# Patient Record
Sex: Male | Born: 1965 | Race: White | Hispanic: No | Marital: Married | Smoking: Former smoker
Health system: Southern US, Community
[De-identification: ages and names within clinical notes are randomized; demographics above are authoritative.]

## PROBLEM LIST (undated history)

## (undated) DIAGNOSIS — R931 Abnormal findings on diagnostic imaging of heart and coronary circulation: Secondary | ICD-10-CM

## (undated) DIAGNOSIS — Z872 Personal history of diseases of the skin and subcutaneous tissue: Secondary | ICD-10-CM

## (undated) DIAGNOSIS — I351 Nonrheumatic aortic (valve) insufficiency: Secondary | ICD-10-CM

## (undated) DIAGNOSIS — I10 Essential (primary) hypertension: Secondary | ICD-10-CM

## (undated) DIAGNOSIS — G709 Myoneural disorder, unspecified: Secondary | ICD-10-CM

## (undated) DIAGNOSIS — R748 Abnormal levels of other serum enzymes: Secondary | ICD-10-CM

## (undated) DIAGNOSIS — E782 Mixed hyperlipidemia: Secondary | ICD-10-CM

## (undated) DIAGNOSIS — R7303 Prediabetes: Secondary | ICD-10-CM

## (undated) DIAGNOSIS — K589 Irritable bowel syndrome without diarrhea: Secondary | ICD-10-CM

## (undated) HISTORY — DX: Myoneural disorder, unspecified: G70.9

## (undated) HISTORY — DX: Irritable bowel syndrome without diarrhea: K58.9

## (undated) HISTORY — DX: Essential (primary) hypertension: I10

## (undated) HISTORY — PX: HERNIA REPAIR: SHX51

## (undated) HISTORY — DX: Mixed hyperlipidemia: E78.2

## (undated) HISTORY — DX: Personal history of diseases of the skin and subcutaneous tissue: Z87.2

## (undated) HISTORY — DX: Abnormal levels of other serum enzymes: R74.8

## (undated) HISTORY — DX: Nonrheumatic aortic (valve) insufficiency: I35.1

## (undated) HISTORY — DX: Prediabetes: R73.03

## (undated) HISTORY — PX: WISDOM TOOTH EXTRACTION: SHX21

## (undated) HISTORY — DX: Abnormal findings on diagnostic imaging of heart and coronary circulation: R93.1

---

## 2006-01-21 HISTORY — PX: COLONOSCOPY: SHX174

## 2016-11-27 ENCOUNTER — Ambulatory Visit: Payer: BLUE CROSS/BLUE SHIELD | Admitting: Family Medicine

## 2016-11-27 ENCOUNTER — Encounter: Payer: Self-pay | Admitting: Family Medicine

## 2016-11-27 VITALS — BP 140/82 | HR 83 | Ht 68.25 in | Wt 202.0 lb

## 2016-11-27 DIAGNOSIS — Z23 Encounter for immunization: Secondary | ICD-10-CM | POA: Diagnosis not present

## 2016-11-27 DIAGNOSIS — I351 Nonrheumatic aortic (valve) insufficiency: Secondary | ICD-10-CM

## 2016-11-27 DIAGNOSIS — K589 Irritable bowel syndrome without diarrhea: Secondary | ICD-10-CM

## 2016-11-27 DIAGNOSIS — Z7689 Persons encountering health services in other specified circumstances: Secondary | ICD-10-CM | POA: Diagnosis not present

## 2016-11-27 DIAGNOSIS — R1011 Right upper quadrant pain: Secondary | ICD-10-CM

## 2016-11-27 DIAGNOSIS — R740 Nonspecific elevation of levels of transaminase and lactic acid dehydrogenase [LDH]: Secondary | ICD-10-CM | POA: Diagnosis not present

## 2016-11-27 DIAGNOSIS — R931 Abnormal findings on diagnostic imaging of heart and coronary circulation: Secondary | ICD-10-CM | POA: Diagnosis not present

## 2016-11-27 DIAGNOSIS — I10 Essential (primary) hypertension: Secondary | ICD-10-CM

## 2016-11-27 DIAGNOSIS — R748 Abnormal levels of other serum enzymes: Secondary | ICD-10-CM | POA: Diagnosis not present

## 2016-11-27 LAB — POCT URINALYSIS DIP (PROADVANTAGE DEVICE)
BILIRUBIN UA: NEGATIVE mg/dL
Bilirubin, UA: NEGATIVE
Blood, UA: NEGATIVE
GLUCOSE UA: NEGATIVE mg/dL
Leukocytes, UA: NEGATIVE
Nitrite, UA: NEGATIVE
Protein Ur, POC: NEGATIVE mg/dL
SPECIFIC GRAVITY, URINE: 1.015
Urobilinogen, Ur: NEGATIVE
pH, UA: 6.5 (ref 5.0–8.0)

## 2016-11-27 MED ORDER — LOSARTAN POTASSIUM 100 MG PO TABS: 100.0000 mg | ORAL_TABLET | Freq: Every day | ORAL | 5 refills | Status: DC

## 2016-11-27 NOTE — Progress Notes (Signed)
Subjective:    Patient ID: Shawn Ramirez, male    DOB: 12/16/1965, 51 y.o.   MRN: [id number]  HPI Chief Complaint  Patient presents with  . new pt    new pt get established. needs a refill bp, pain under right rib off and on for about a month. will get flu shot today   He is here to establish care. Recently moved here from [place] with his wife. Prior to that they were living in [place]. They are both truck drivers.   Complaints of intermittent RUQ pain for at least one month that is dull and non radiating. Pain typically lasts a few minutes and he cannot relate the pain to anything specific. Not worsening or improved with eating, movement, bowel movements or passing gas. States pain has not been worsening or becoming more frequent.  Last episode of pain 2-3 days ago. Denies nausea, vomiting.   HTN- diagnosed 12-15 years ago. Checks BP at home and his readings have been "normal". States his medication was changed in the past few months and he is doing well on Losartan.   States he has history of "leaky valve" in his heart. Reports having an abnormal echocardiogram in [place] earlier this year.  Denies chest pain, palpitations, shortness of breath.   History of diarrhea. No changes. States he has had colonoscopy for the same issues and everything was fine.  States he can avoid dairy and a lot of fiber and this helps his symptoms. Last colonoscopy over 10 years ago.   Urinary hesitancy for the past several months. Reports history of glucosuria for at least 10 years without elevated serum glucose. PSA checked in fall of 2017 and normal.   Family history of diabetes-mother and MGF, HTN. Father HTN- cataracts. Siblings HTN.    Reviewed allergies, medications, past medical, surgical,  and social history.    Review of Systems  Pertinent positives and negatives in the history of present illness.     Objective:   Physical Exam  Constitutional: He is oriented to person, place, and time. He appears  well-developed and well-nourished. No distress.  HENT:  Mouth/Throat: Oropharynx is clear and moist.  Eyes: Conjunctivae are normal.  Neck: Normal range of motion. Neck supple.  Cardiovascular: Normal rate, regular rhythm and intact distal pulses.  Pulmonary/Chest: Effort normal and breath sounds normal.  Abdominal: Soft. Bowel sounds are normal. He exhibits no distension. There is no hepatosplenomegaly. There is no tenderness. There is no rebound, no guarding, no CVA tenderness, no tenderness at McBurney's point and negative Murphy's sign.  Lymphadenopathy:    He has no cervical adenopathy.  Neurological: He is alert and oriented to person, place, and time.  Skin: Skin is warm and dry. No rash noted. No pallor.  Psychiatric: He has a normal mood and affect. His speech is normal and behavior is normal. Thought content normal.   BP 140/82   Pulse 83   Ht 5' 8.25" (1.734 m)   Wt 202 lb (91.6 kg)   BMI 30.49 kg/m       Assessment & Plan:  Essential hypertension - Plan: CBC with Differential/Platelet, Comprehensive metabolic panel  Needs flu shot - Plan: Flu Vaccine QUAD 36+ mos IM  Encounter to establish care  RUQ pain - Plan: CBC with Differential/Platelet, Comprehensive metabolic panel, POCT Urinalysis DIP (Proadvantage Device)  Elevated liver enzymes  Abnormal echocardiogram  Irritable bowel syndrome, unspecified type  Aortic valve insufficiency, etiology of cardiac valve disease unspecified  Discussed that his  BP is not at goal. Recommend that he keep a close eye on his BP outside of here and report back readings consistently not at goal.  RUQ pain is intermittent and nonspecific. No sign of infectious process. Recommend he pay close attention to his symptoms and look for any triggers or associated symptoms. We will revisit this when he returns for his visit in January or sooner if he is worsening. Will also check labs. Discussed option of US and we will hold off for now.    History of elevated liver enzymes per his record from [place]. He drinks alcohol occasionally.  IBS symptoms appear to be managed with diet for now. He has taken Lomotil most recently per records.  Abnormal echo with normal EF. He is asymptomatic.  Flu shot given.  F/U for fasting CPE. He will be due for colonoscopy.

## 2016-11-27 NOTE — Patient Instructions (Signed)
Goal BP is <130/80.   Keep a close eye on abdominal pain and any related symptoms and let me know if this is getting worse. We will call you with lab results.

## 2016-11-28 ENCOUNTER — Encounter: Payer: Self-pay | Admitting: Family Medicine

## 2016-11-28 DIAGNOSIS — E782 Mixed hyperlipidemia: Secondary | ICD-10-CM | POA: Insufficient documentation

## 2016-11-28 DIAGNOSIS — R931 Abnormal findings on diagnostic imaging of heart and coronary circulation: Secondary | ICD-10-CM | POA: Insufficient documentation

## 2016-11-28 DIAGNOSIS — I351 Nonrheumatic aortic (valve) insufficiency: Secondary | ICD-10-CM

## 2016-11-28 DIAGNOSIS — Z872 Personal history of diseases of the skin and subcutaneous tissue: Secondary | ICD-10-CM

## 2016-11-28 DIAGNOSIS — O99814 Abnormal glucose complicating childbirth: Secondary | ICD-10-CM | POA: Insufficient documentation

## 2016-11-28 DIAGNOSIS — K760 Fatty (change of) liver, not elsewhere classified: Secondary | ICD-10-CM | POA: Insufficient documentation

## 2016-11-28 DIAGNOSIS — I517 Cardiomegaly: Secondary | ICD-10-CM | POA: Insufficient documentation

## 2016-11-28 DIAGNOSIS — R7303 Prediabetes: Secondary | ICD-10-CM

## 2016-11-28 DIAGNOSIS — R748 Abnormal levels of other serum enzymes: Secondary | ICD-10-CM

## 2016-11-28 DIAGNOSIS — K589 Irritable bowel syndrome without diarrhea: Secondary | ICD-10-CM | POA: Insufficient documentation

## 2016-11-28 HISTORY — DX: Mixed hyperlipidemia: E78.2

## 2016-11-28 HISTORY — DX: Irritable bowel syndrome, unspecified: K58.9

## 2016-11-28 HISTORY — DX: Abnormal levels of other serum enzymes: R74.8

## 2016-11-28 HISTORY — DX: Abnormal findings on diagnostic imaging of heart and coronary circulation: R93.1

## 2016-11-28 HISTORY — DX: Prediabetes: R73.03

## 2016-11-28 HISTORY — DX: Personal history of diseases of the skin and subcutaneous tissue: Z87.2

## 2016-11-28 HISTORY — DX: Nonrheumatic aortic (valve) insufficiency: I35.1

## 2016-11-28 LAB — CBC WITH DIFFERENTIAL/PLATELET
BASOS PCT: 0.3 %
Basophils Absolute: 20 cells/uL (ref 0–200)
Eosinophils Absolute: 127 cells/uL (ref 15–500)
Eosinophils Relative: 1.9 %
HCT: 43 % (ref 38.5–50.0)
Hemoglobin: 14.8 g/dL (ref 13.2–17.1)
Lymphs Abs: 2727 cells/uL (ref 850–3900)
MCH: 30.4 pg (ref 27.0–33.0)
MCHC: 34.4 g/dL (ref 32.0–36.0)
MCV: 88.3 fL (ref 80.0–100.0)
MONOS PCT: 6.9 %
MPV: 11.5 fL (ref 7.5–12.5)
NEUTROS ABS: 3363 {cells}/uL (ref 1500–7800)
Neutrophils Relative %: 50.2 %
PLATELETS: 270 10*3/uL (ref 140–400)
RBC: 4.87 10*6/uL (ref 4.20–5.80)
RDW: 12.1 % (ref 11.0–15.0)
TOTAL LYMPHOCYTE: 40.7 %
WBC: 6.7 10*3/uL (ref 3.8–10.8)
WBCMIX: 462 {cells}/uL (ref 200–950)

## 2016-11-28 LAB — COMPREHENSIVE METABOLIC PANEL
AG Ratio: 1.8 (calc) (ref 1.0–2.5)
ALBUMIN MSPROF: 4.5 g/dL (ref 3.6–5.1)
ALT: 60 U/L — ABNORMAL HIGH (ref 9–46)
AST: 34 U/L (ref 10–35)
Alkaline phosphatase (APISO): 59 U/L (ref 40–115)
BUN: 11 mg/dL (ref 7–25)
CHLORIDE: 100 mmol/L (ref 98–110)
CO2: 27 mmol/L (ref 20–32)
CREATININE: 0.9 mg/dL (ref 0.70–1.33)
Calcium: 9.6 mg/dL (ref 8.6–10.3)
GLOBULIN: 2.5 g/dL (ref 1.9–3.7)
GLUCOSE: 90 mg/dL (ref 65–99)
POTASSIUM: 4.4 mmol/L (ref 3.5–5.3)
SODIUM: 136 mmol/L (ref 135–146)
Total Bilirubin: 0.8 mg/dL (ref 0.2–1.2)
Total Protein: 7 g/dL (ref 6.1–8.1)

## 2016-11-28 LAB — HEPATITIS PANEL, ACUTE
HEP A IGM: NONREACTIVE
HEP C AB: NONREACTIVE
Hep B C IgM: NONREACTIVE
Hepatitis B Surface Ag: NONREACTIVE
SIGNAL TO CUT-OFF: 0.01 (ref <1.00)

## 2016-11-28 LAB — TEST AUTHORIZATION

## 2016-12-19 ENCOUNTER — Encounter: Payer: Self-pay | Admitting: Family Medicine

## 2016-12-20 ENCOUNTER — Encounter: Payer: Self-pay | Admitting: Family Medicine

## 2017-01-22 ENCOUNTER — Encounter: Payer: Self-pay | Admitting: Family Medicine

## 2017-01-22 ENCOUNTER — Encounter: Payer: Self-pay | Admitting: Gastroenterology

## 2017-01-22 ENCOUNTER — Ambulatory Visit (INDEPENDENT_AMBULATORY_CARE_PROVIDER_SITE_OTHER): Payer: BLUE CROSS/BLUE SHIELD | Admitting: Family Medicine

## 2017-01-22 VITALS — BP 136/88 | HR 75 | Ht 68.0 in | Wt 201.1 lb

## 2017-01-22 DIAGNOSIS — R7303 Prediabetes: Secondary | ICD-10-CM | POA: Diagnosis not present

## 2017-01-22 DIAGNOSIS — I10 Essential (primary) hypertension: Secondary | ICD-10-CM

## 2017-01-22 DIAGNOSIS — R748 Abnormal levels of other serum enzymes: Secondary | ICD-10-CM | POA: Diagnosis not present

## 2017-01-22 DIAGNOSIS — E782 Mixed hyperlipidemia: Secondary | ICD-10-CM

## 2017-01-22 DIAGNOSIS — Z1211 Encounter for screening for malignant neoplasm of colon: Secondary | ICD-10-CM

## 2017-01-22 DIAGNOSIS — Z0000 Encounter for general adult medical examination without abnormal findings: Secondary | ICD-10-CM | POA: Diagnosis not present

## 2017-01-22 DIAGNOSIS — Z125 Encounter for screening for malignant neoplasm of prostate: Secondary | ICD-10-CM

## 2017-01-22 DIAGNOSIS — Z114 Encounter for screening for human immunodeficiency virus [HIV]: Secondary | ICD-10-CM | POA: Diagnosis not present

## 2017-01-22 LAB — POCT URINALYSIS DIP (PROADVANTAGE DEVICE)
BILIRUBIN UA: NEGATIVE mg/dL
Bilirubin, UA: NEGATIVE
Glucose, UA: NEGATIVE mg/dL
Leukocytes, UA: NEGATIVE
Nitrite, UA: NEGATIVE
PH UA: 6 (ref 5.0–8.0)
PROTEIN UA: NEGATIVE mg/dL
RBC UA: NEGATIVE
SPECIFIC GRAVITY, URINE: 1.03
UUROB: 3.5

## 2017-01-22 NOTE — Patient Instructions (Addendum)
Check your blood pressure every day for the next 3 weeks. Keep a record of these.  If your blood pressure is not consistently less than 130/80 then call in 3 weeks and let me know.  Eat a low salt diet.   Your liver enzymes have been elevated so I am repeating them today. It is good that he stopped drinking alcohol.  I recommend that you start being more physically active.  The recommendation is 150 minutes of physical activity per week.  This will help with your blood sugars and blood pressure.  Call and schedule a dental appointment.  You can check with your insurance as to who is in your network.  You will receive a call from the GI office to schedule a visit to discuss a colonoscopy.   We will call you with your lab results and any further recommendations.    Preventative Care for Adults, Male       REGULAR HEALTH EXAMS:  A routine yearly physical is a good way to check in with your primary care provider about your health and preventive screening. It is also an opportunity to share updates about your health and any concerns you have, and receive a thorough all-over exam.   Most health insurance companies pay for at least some preventative services.  Check with your health plan for specific coverages.  WHAT PREVENTATIVE SERVICES DO MEN NEED?  Adult men should have their weight and blood pressure checked regularly.   Men age 53 and older should have their cholesterol levels checked regularly.  Beginning at age 53 and continuing to age 53, men should be screened for colorectal cancer.  Certain people should may need continued testing until age 53.  Other cancer screening may include exams for testicular and prostate cancer.  Updating vaccinations is part of preventative care.  Vaccinations help protect against diseases such as the flu.  Lab tests are generally done as part of preventative care to screen for anemia and blood disorders, to screen for problems with the kidneys and  liver, to screen for bladder problems, to check blood sugar, and to check your cholesterol level.  Preventative services generally include counseling about diet, exercise, avoiding tobacco, drugs, excessive alcohol consumption, and sexually transmitted infections.    GENERAL RECOMMENDATIONS FOR GOOD HEALTH:  Healthy diet:  Eat a variety of foods, including fruit, vegetables, animal or vegetable protein, such as meat, fish, chicken, and eggs, or beans, lentils, tofu, and grains, such as rice.  Drink plenty of water daily.  Decrease saturated fat in the diet, avoid lots of red meat, processed foods, sweets, fast foods, and fried foods.  Exercise:  Aerobic exercise helps maintain good heart health. At least 30-40 minutes of moderate-intensity exercise is recommended. For example, a brisk walk that increases your heart rate and breathing. This should be done on most days of the week.   Find a type of exercise or a variety of exercises that you enjoy so that it becomes a part of your daily life.  Examples are running, walking, swimming, water aerobics, and biking.  For motivation and support, explore group exercise such as aerobic class, spin class, Zumba, Yoga,or  martial arts, etc.    Set exercise goals for yourself, such as a certain weight goal, walk or run in a race such as a 5k walk/run.  Speak to your primary care provider about exercise goals.  Disease prevention:  If you smoke or chew tobacco, find out from your caregiver how to  quit. It can literally save your life, no matter how long you have been a tobacco user. If you do not use tobacco, never begin.   Maintain a healthy diet and normal weight. Increased weight leads to problems with blood pressure and diabetes.   The Body Mass Index or BMI is a way of measuring how much of your body is fat. Having a BMI above 27 increases the risk of heart disease, diabetes, hypertension, stroke and other problems related to obesity. Your  caregiver can help determine your BMI and based on it develop an exercise and dietary program to help you achieve or maintain this important measurement at a healthful level.  High blood pressure causes heart and blood vessel problems.  Persistent high blood pressure should be treated with medicine if weight loss and exercise do not work.   Fat and cholesterol leaves deposits in your arteries that can block them. This causes heart disease and vessel disease elsewhere in your body.  If your cholesterol is found to be high, or if you have heart disease or certain other medical conditions, then you may need to have your cholesterol monitored frequently and be treated with medication.   Ask if you should have a stress test if your history suggests this. A stress test is a test done on a treadmill that looks for heart disease. This test can find disease prior to there being a problem.  Avoid drinking alcohol in excess (more than two drinks per day).  Avoid use of street drugs. Do not share needles with anyone. Ask for professional help if you need assistance or instructions on stopping the use of alcohol, cigarettes, and/or drugs.  Brush your teeth twice a day with fluoride toothpaste, and floss once a day. Good oral hygiene prevents tooth decay and gum disease. The problems can be painful, unattractive, and can cause other health problems. Visit your dentist for a routine oral and dental check up and preventive care every 6-12 months.   Look at your skin regularly.  Use a mirror to look at your back. Notify your caregivers of changes in moles, especially if there are changes in shapes, colors, a size larger than a pencil eraser, an irregular border, or development of new moles.  Safety:  Use seatbelts 100% of the time, whether driving or as a passenger.  Use safety devices such as hearing protection if you work in environments with loud noise or significant background noise.  Use safety glasses when  doing any work that could send debris in to the eyes.  Use a helmet if you ride a bike or motorcycle.  Use appropriate safety gear for contact sports.  Talk to your caregiver about gun safety.  Use sunscreen with a SPF (or skin protection factor) of 15 or greater.  Lighter skinned people are at a greater risk of skin cancer. Don't forget to also wear sunglasses in order to protect your eyes from too much damaging sunlight. Damaging sunlight can accelerate cataract formation.   Practice safe sex. Use condoms. Condoms are used for birth control and to help reduce the spread of sexually transmitted infections (or STIs).  Some of the STIs are gonorrhea (the clap), chlamydia, syphilis, trichomonas, herpes, HPV (human papilloma virus) and HIV (human immunodeficiency virus) which causes AIDS. The herpes, HIV and HPV are viral illnesses that have no cure. These can result in disability, cancer and death.   Keep carbon monoxide and smoke detectors in your home functioning at all times. Change  the batteries every 6 months or use a model that plugs into the wall.   Vaccinations:  Stay up to date with your tetanus shots and other required immunizations. You should have a booster for tetanus every 10 years. Be sure to get your flu shot every year, since 5%-20% of the [place] population comes down with the flu. The flu vaccine changes each year, so being vaccinated once is not enough. Get your shot in the fall, before the flu season peaks.   Other vaccines to consider:  Pneumococcal vaccine to protect against certain types of pneumonia.  This is normally recommended for adults age 22 or older.  However, adults younger than 53 years old with certain underlying conditions such as diabetes, heart or lung disease should also receive the vaccine.  Shingles vaccine to protect against Varicella Zoster if you are older than age 74, or younger than 53 years old with certain underlying illness.  Hepatitis A vaccine to protect  against a form of infection of the liver by a virus acquired from food.  Hepatitis B vaccine to protect against a form of infection of the liver by a virus acquired from blood or body fluids, particularly if you work in health care.  If you plan to travel internationally, check with your local health department for specific vaccination recommendations.  Cancer Screening:  Most routine colon cancer screening begins at the age of 62. On a yearly basis, doctors may provide special easy to use take-home tests to check for hidden blood in the stool. Sigmoidoscopy or colonoscopy can detect the earliest forms of colon cancer and is life saving. These tests use a small camera at the end of a tube to directly examine the colon. Speak to your caregiver about this at age 31, when routine screening begins (and is repeated every 5 years unless early forms of pre-cancerous polyps or small growths are found).   At the age of 57 men usually start screening for prostate cancer every year. Screening may begin at a younger age for those with higher risk. Those at higher risk include African-Americans or having a family history of prostate cancer. There are two types of tests for prostate cancer:   Prostate-specific antigen (PSA) testing. Recent studies raise questions about prostate cancer using PSA and you should discuss this with your caregiver.   Digital rectal exam (in which your doctor's lubricated and gloved finger feels for enlargement of the prostate through the anus).   Screening for testicular cancer.  Do a monthly exam of your testicles. Gently roll each testicle between your thumb and fingers, feeling for any abnormal lumps. The best time to do this is after a hot shower or bath when the tissues are looser. Notify your caregivers of any lumps, tenderness or changes in size or shape immediately.

## 2017-01-22 NOTE — Progress Notes (Signed)
Subjective:    Patient ID: Shawn Ramirez, male    DOB: [birth year], 52 y.o.   MRN: [id number]  HPI Chief Complaint  Patient presents with  . Annual Exam   He is fairly new to me and our practice and here for a complete physical exam. Previous medical care: moved here from  Last CPE: over a year ago.   HTN- has not been checking his BP at home.  Has taken lisinopril-HCTZ in the past. Cannot recall when or why he stopped taking it.   History of prediabetes with Hgb A1c 6.4% in the past.  History of elevated LFTs.   History of abnormal echocardiogram with mild aortic insufficiency.  Patient states he was told he had a "leaky valve" that is nothing to worry about.  This was done by his PCP in [place].  He was not referred to a cardiologist and states he was told he did not see a need to see one.  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  Diet: fairly healthy  Exercise: nothing regular   Immunizations: flu shot. Tdap 2013  Health maintenance:  Colonoscopy: at least 10 years ago in [place], [place].  Last PSA: denies having this checked.  Last Dental Exam: over a year Last Eye Exam: within the past year   Wears seatbelt always, uses sunscreen, smoke detectors in home and functioning, does not text while driving, feels safe in home environment.  Reviewed allergies, medications, past medical, surgical, family, and social history.    Review of Systems Review of Systems Constitutional: -fever, -chills, -sweats, -unexpected weight change,-fatigue ENT: -runny nose, -ear pain, -sore throat Cardiology:  -chest pain, -palpitations, -edema Respiratory: -cough, -shortness of breath, -wheezing Gastroenterology: -abdominal pain, -nausea, -vomiting, -diarrhea, -constipation  Hematology: -bleeding or bruising problems Musculoskeletal: -arthralgias, -myalgias, -joint  swelling, -back pain Ophthalmology: -vision changes Urology: -dysuria, -difficulty urinating, -hematuria, -urinary frequency, -urgency Neurology: -headache, -weakness, -tingling, -numbness       Objective:   Physical Exam BP 136/88   Pulse 75   Ht 5\' 8"  (1.727 m)   Wt 201 lb 1.6 oz (91.2 kg)   SpO2 96%   BMI 30.58 kg/m   General Appearance:    Alert, cooperative, no distress, appears stated age  Head:    Normocephalic, without obvious abnormality, atraumatic  Eyes:    PERRL, conjunctiva/corneas clear, EOM's intact, fundi    benign  Ears:    Normal TM's and external ear canals  Nose:   Nares normal, mucosa normal, no drainage or sinus   tenderness  Throat:   Lips, mucosa, and tongue normal; teeth and gums normal  Neck:   Supple, no lymphadenopathy;  thyroid:  no   enlargement/tenderness/nodules; no carotid   bruit or JVD  Back:    Spine nontender, no curvature, ROM normal, no CVA     tenderness  Lungs:     Clear to auscultation bilaterally without wheezes, rales or     ronchi; respirations unlabored  Chest Wall:    No tenderness or deformity   Heart:    Regular rate and rhythm, S1 and S2 normal, no murmur, rub   or gallop  Breast Exam:    No chest wall tenderness, masses or gynecomastia  Abdomen:     Soft, non-tender, nondistended, normoactive bowel sounds,    no masses, no hepatosplenomegaly  Genitalia:  Normal male external genitalia without lesions.  Testicles without masses.  No inguinal hernias.     Extremities:   No clubbing, cyanosis or edema  Pulses:   2+ and symmetric all extremities  Skin:   Skin color, texture, turgor normal, no rashes or lesions  Lymph nodes:   Cervical, supraclavicular, and axillary nodes normal  Neurologic:   CNII-XII intact, normal strength, sensation and gait; reflexes 2+ and symmetric throughout          Psych:   Normal mood, affect, hygiene and grooming.     Urinalysis dipstick: negative       Assessment & Plan:  Routine general  medical examination at a health care facility - Plan: POCT Urinalysis DIP (Proadvantage Device), Lipid panel, HIV antibody, Comprehensive metabolic panel  Essential hypertension  Mixed hyperlipidemia - Plan: Lipid panel  Prediabetes - Plan: POCT Urinalysis DIP (Proadvantage Device), Comprehensive metabolic panel, Hemoglobin A1c  Elevated liver enzymes - Plan: POCT Urinalysis DIP (Proadvantage Device), Comprehensive metabolic panel  Screen for colon cancer - Plan: Ambulatory referral to Gastroenterology  Screening for prostate cancer - Plan: PSA  Screening for HIV (human immunodeficiency virus) - Plan: HIV antibody  He appears to be doing well physically and emotionally. Discussed that his blood pressure is not in goal range today.  Recommend that he start checking it at home on a daily basis and call in 3 weeks to let me know if the majority of his readings are abnormal.  He will continue on current medication.  We discussed lifestyle modifications such as eating a low-salt diet and increasing his physical activity.  He was given a handout on the [employer] diet. Lifestyle modifications were also discussed for prediabetes.  We will check a hemoglobin A1c.  As far as I can tell from his medical records his A1c last year was 6.4%. Elevated liver enzymes in the past and in November in our office.  Negative hepatitis panel.  Discussed that if his liver enzymes are still abnormal that I will send him for an ultrasound to take a look at his liver. Congratulated him on stopping alcohol use. Apparently he had a colonoscopy more than 10 years ago and [place] due to chronic diarrhea. GI referral for screening colonoscopy ordered. Health maintenance-screening recommendations ordered. He appears to be up-to-date on flu shot and all immunizations. We will follow-up pending labs and he will call in 3 weeks with his blood pressure readings.

## 2017-01-23 ENCOUNTER — Telehealth: Payer: Self-pay

## 2017-01-23 ENCOUNTER — Other Ambulatory Visit: Payer: Self-pay

## 2017-01-23 DIAGNOSIS — R748 Abnormal levels of other serum enzymes: Secondary | ICD-10-CM

## 2017-01-23 LAB — LIPID PANEL
CHOL/HDL RATIO: 4.2 (calc) (ref <5.0)
CHOLESTEROL: 172 mg/dL (ref <200)
HDL: 41 mg/dL (ref >40)
LDL CHOLESTEROL (CALC): 113 mg/dL — AB
Non-HDL Cholesterol (Calc): 131 mg/dL (calc) — ABNORMAL HIGH (ref <130)
Triglycerides: 82 mg/dL (ref <150)

## 2017-01-23 LAB — COMPREHENSIVE METABOLIC PANEL
AG RATIO: 1.7 (calc) (ref 1.0–2.5)
ALBUMIN MSPROF: 4.7 g/dL (ref 3.6–5.1)
ALKALINE PHOSPHATASE (APISO): 67 U/L (ref 40–115)
ALT: 54 U/L — ABNORMAL HIGH (ref 9–46)
AST: 31 U/L (ref 10–35)
BUN: 21 mg/dL (ref 7–25)
CHLORIDE: 104 mmol/L (ref 98–110)
CO2: 26 mmol/L (ref 20–32)
CREATININE: 1.02 mg/dL (ref 0.70–1.33)
Calcium: 9.5 mg/dL (ref 8.6–10.3)
GLOBULIN: 2.7 g/dL (ref 1.9–3.7)
Glucose, Bld: 106 mg/dL — ABNORMAL HIGH (ref 65–99)
POTASSIUM: 4.6 mmol/L (ref 3.5–5.3)
Sodium: 138 mmol/L (ref 135–146)
Total Bilirubin: 0.6 mg/dL (ref 0.2–1.2)
Total Protein: 7.4 g/dL (ref 6.1–8.1)

## 2017-01-23 LAB — HEMOGLOBIN A1C
HEMOGLOBIN A1C: 6.1 %{Hb} — AB (ref <5.7)
MEAN PLASMA GLUCOSE: 128 (calc)
eAG (mmol/L): 7.1 (calc)

## 2017-01-23 LAB — PSA: PSA: 1.8 ng/mL (ref <=4.0)

## 2017-01-23 LAB — HIV ANTIBODY (ROUTINE TESTING W REFLEX): HIV 1&2 Ab, 4th Generation: NONREACTIVE

## 2017-01-23 NOTE — Telephone Encounter (Signed)
Called and LVM advising patient to call back to discuss the lab results and the need for US. Left call back number.

## 2017-01-23 NOTE — Telephone Encounter (Signed)
-----   Message from Avanell ShackletonVickie L Henson, NP-C sent at 01/23/2017  8:32 AM EST ----- He has prediabetes which means he should pay close attention to his sugar and carbohydrate intake and increase his physical activity to prevent this from progressing to diabetes.  Also, his LDL or "bad" cholesterol is mildly elevated so he should pay close attention to his fried foods and fatty intake and cut back.  Exercise will also improve this. His prostate test is normal.  One of his liver enzymes is still elevated in spite of him stopping alcohol so I would like to send him for a RUQ US to look at his liver. Please order a RUQ US due to elevated ALT and intermittent RUQ pain.

## 2017-02-10 ENCOUNTER — Other Ambulatory Visit: Payer: Self-pay

## 2017-02-17 ENCOUNTER — Ambulatory Visit
Admission: RE | Admit: 2017-02-17 | Discharge: 2017-02-17 | Disposition: A | Discharge status: Home / Self Care | Payer: BLUE CROSS/BLUE SHIELD | Source: Ambulatory Visit | Attending: Family Medicine | Admitting: Family Medicine

## 2017-02-17 DIAGNOSIS — R945 Abnormal results of liver function studies: Secondary | ICD-10-CM | POA: Diagnosis not present

## 2017-02-17 DIAGNOSIS — R748 Abnormal levels of other serum enzymes: Secondary | ICD-10-CM

## 2017-02-20 ENCOUNTER — Telehealth: Payer: Self-pay | Admitting: Family Medicine

## 2017-02-20 NOTE — Telephone Encounter (Signed)
The report mentions that he may have fatty liver and no level of severity is noted. He may want to schedule an office visit if he has several questions. We can repeat his liver function tests in 6-8 weeks and see how things are looking. If his liver enzymes are not improving or if they are getting worse, then I can refer him to GI for further evaluation. He may see GI now if he would like but I do not think they would not do anything differently at this point.

## 2017-02-20 NOTE — Telephone Encounter (Signed)
Left message for pt to call me back 

## 2017-02-20 NOTE — Telephone Encounter (Signed)
Please advise 

## 2017-02-20 NOTE — Telephone Encounter (Signed)
Pt called requesting a call back to discuss labs results further. He specifically wants to know about liver results and if there is a level of severity for fatty liver disease.

## 2017-02-21 NOTE — Telephone Encounter (Signed)
Pt was notified and will follow-up in 6-8 weeks

## 2017-02-21 NOTE — Telephone Encounter (Signed)
Left message for pt to call me back 

## 2017-02-24 ENCOUNTER — Ambulatory Visit (AMBULATORY_SURGERY_CENTER): Payer: Self-pay | Admitting: *Deleted

## 2017-02-24 ENCOUNTER — Other Ambulatory Visit: Payer: Self-pay

## 2017-02-24 VITALS — Ht 68.0 in | Wt 200.0 lb

## 2017-02-24 DIAGNOSIS — Z1211 Encounter for screening for malignant neoplasm of colon: Secondary | ICD-10-CM

## 2017-02-24 MED ORDER — NA SULFATE-K SULFATE-MG SULF 17.5-3.13-1.6 GM/177ML PO SOLN: 1.0000 | Freq: Once | ORAL | 0 refills | Status: AC

## 2017-02-24 NOTE — Progress Notes (Signed)
No egg or soy allergy known to patient  No issues with past sedation with any surgeries  or procedures, no intubation problems  No diet pills per patient No home 02 use per patient  No blood thinners per patient  Pt denies issues with constipation  No A fib or A flutter  EMMI video sent to pt's e mail pt declined   

## 2017-02-25 ENCOUNTER — Encounter: Payer: Self-pay | Admitting: Gastroenterology

## 2017-03-11 ENCOUNTER — Encounter: Payer: Self-pay | Admitting: Gastroenterology

## 2017-03-31 ENCOUNTER — Encounter: Payer: Self-pay | Admitting: Family Medicine

## 2017-03-31 ENCOUNTER — Ambulatory Visit: Payer: BLUE CROSS/BLUE SHIELD | Admitting: Family Medicine

## 2017-03-31 VITALS — BP 120/70 | HR 62 | Wt 190.0 lb

## 2017-03-31 DIAGNOSIS — R748 Abnormal levels of other serum enzymes: Secondary | ICD-10-CM

## 2017-03-31 DIAGNOSIS — I10 Essential (primary) hypertension: Secondary | ICD-10-CM | POA: Diagnosis not present

## 2017-03-31 DIAGNOSIS — R197 Diarrhea, unspecified: Secondary | ICD-10-CM | POA: Diagnosis not present

## 2017-03-31 NOTE — Progress Notes (Signed)
   Subjective:    Patient ID: Shawn MccallumGlen Hagood, male    DOB: 08/07/1965, 52 y.o.   MRN: [id number]  HPI Chief Complaint  Patient presents with  . follow-up    follow-up on liver enzymes   He is here to follow up on elevated liver enzymes. States his "liver has been hurting" Continues to complain of intermittent right upper quadrant pain.   States he has not had pain in at least 3 or 4 days.  States he thought fatty liver could cause this type of pain.  He also thinks his diet may contribute to abdominal pain and intermittent diarrhea.  This has not worsened since his last visit.  No new symptoms. He reports that he stopped drinking alcohol when he found out his liver enzymes were elevated.  This was months ago.   Denies fever, chills, unexplained fatigue, dizziness, chest pain, palpitations, shortness of breath, cough, nausea, vomiting.  States he went to see GI and he was told he has to pay $2,500 up front so he has not scheduled a visit with them.   Apparently he has been taking Losartan for years. He thinks this medication may be causing his intermittent diarrhea.  He has tried lisinopril in the past- he was switched but does not know   HCTZ in the past and he did not like that this medication made him urinate.    Reviewed allergies, medications, past medical, surgical, family, and social history.   Review of Systems Pertinent positives and negatives in the history of present illness.      Objective:   Physical Exam BP 120/70   Pulse 62   Wt 190 lb (86.2 kg)   BMI 28.89 kg/m  Alert and oriented and in no acute distress.  Not otherwise examined.       Assessment & Plan:  Elevated liver enzymes - Plan: Comprehensive metabolic panel  Intermittent diarrhea  Essential hypertension  He plans to stop the losartan because he thinks this is causing his diarrhea even though I reassured him this is unlikely based on the fact he had diarrhea prior to starting the medication and the  diarrhea is intermittent while he takes losartan daily.  He plans to start taking garlic for his blood pressure.  I advised that his blood pressure has been pretty well controlled on the losartan and that if he stops the medication that he will need to keep a close eye on his blood pressure. Congratulated him on stopping alcohol use.  Discussed that in general, fatty liver does not cause pain.  He does not seem to have a good understanding about his health. We will repeat liver enzymes today and have him follow-up with GI.  My CMA did call [facility] and I reassured the patient that they will bill his insurance and see him without him paying his deductible upfront.

## 2017-03-31 NOTE — Patient Instructions (Addendum)
Goal BP is <130/80. Your BP today is in goal range.   Call and schedule an appointment to follow up with your gastroenterologist.    DASH Eating Plan DASH stands for "Dietary Approaches to Stop Hypertension." The DASH eating plan is a healthy eating plan that has been shown to reduce high blood pressure (hypertension). It may also reduce your risk for type 2 diabetes, heart disease, and stroke. The DASH eating plan may also help with weight loss. What are tips for following this plan? General guidelines  Avoid eating more than 2,300 mg (milligrams) of salt (sodium) a day. If you have hypertension, you may need to reduce your sodium intake to 1,500 mg a day.  Limit alcohol intake to no more than 1 drink a day for nonpregnant women and 2 drinks a day for men. One drink equals 12 oz of beer, 5 oz of wine, or 1 oz of hard liquor.  Work with your health care provider to maintain a healthy body weight or to lose weight. Ask what an ideal weight is for you.  Get at least 30 minutes of exercise that causes your heart to beat faster (aerobic exercise) most days of the week. Activities may include walking, swimming, or biking.  Work with your health care provider or diet and nutrition specialist (dietitian) to adjust your eating plan to your individual calorie needs. Reading food labels  Check food labels for the amount of sodium per serving. Choose foods with less than 5 percent of the Daily Value of sodium. Generally, foods with less than 300 mg of sodium per serving fit into this eating plan.  To find whole grains, look for the word "whole" as the first word in the ingredient list. Shopping  Buy products labeled as "low-sodium" or "no salt added."  Buy fresh foods. Avoid canned foods and premade or frozen meals. Cooking  Avoid adding salt when cooking. Use salt-free seasonings or herbs instead of table salt or sea salt. Check with your health care provider or pharmacist before using salt  substitutes.  Do not fry foods. Cook foods using healthy methods such as baking, boiling, grilling, and broiling instead.  Cook with heart-healthy oils, such as olive, canola, soybean, or sunflower oil. Meal planning   Eat a balanced diet that includes: ? 5 or more servings of fruits and vegetables each day. At each meal, try to fill half of your plate with fruits and vegetables. ? Up to 6-8 servings of whole grains each day. ? Less than 6 oz of lean meat, poultry, or fish each day. A 3-oz serving of meat is about the same size as a deck of cards. One egg equals 1 oz. ? 2 servings of low-fat dairy each day. ? A serving of nuts, seeds, or beans 5 times each week. ? Heart-healthy fats. Healthy fats called Omega-3 fatty acids are found in foods such as flaxseeds and coldwater fish, like sardines, salmon, and mackerel.  Limit how much you eat of the following: ? Canned or prepackaged foods. ? Food that is high in trans fat, such as fried foods. ? Food that is high in saturated fat, such as fatty meat. ? Sweets, desserts, sugary drinks, and other foods with added sugar. ? Full-fat dairy products.  Do not salt foods before eating.  Try to eat at least 2 vegetarian meals each week.  Eat more home-cooked food and less restaurant, buffet, and fast food.  When eating at a restaurant, ask that your food be prepared  with less salt or no salt, if possible. What foods are recommended? The items listed may not be a complete list. Talk with your dietitian about what dietary choices are best for you. Grains Whole-grain or whole-wheat bread. Whole-grain or whole-wheat pasta. Brown rice. [place]. Bulgur. Whole-grain and low-sodium cereals. Pita bread. Low-fat, low-sodium crackers. Whole-wheat flour tortillas. Vegetables Fresh or frozen vegetables (raw, steamed, roasted, or grilled). Low-sodium or reduced-sodium tomato and vegetable juice. Low-sodium or reduced-sodium tomato sauce and tomato  paste. Low-sodium or reduced-sodium canned vegetables. Fruits All fresh, dried, or frozen fruit. Canned fruit in natural juice (without added sugar). Meat and other protein foods Skinless chicken or [place]. Ground chicken or [place]. Pork with fat trimmed off. Fish and seafood. Egg whites. Dried beans, peas, or lentils. Unsalted nuts, nut butters, and seeds. Unsalted canned beans. Lean cuts of beef with fat trimmed off. Low-sodium, lean deli meat. Dairy Low-fat (1%) or fat-free (skim) milk. Fat-free, low-fat, or reduced-fat cheeses. Nonfat, low-sodium ricotta or cottage cheese. Low-fat or nonfat yogurt. Low-fat, low-sodium cheese. Fats and oils Soft margarine without trans fats. Vegetable oil. Low-fat, reduced-fat, or light mayonnaise and salad dressings (reduced-sodium). Canola, safflower, olive, soybean, and sunflower oils. Avocado. Seasoning and other foods Herbs. Spices. Seasoning mixes without salt. Unsalted popcorn and pretzels. Fat-free sweets. What foods are not recommended? The items listed may not be a complete list. Talk with your dietitian about what dietary choices are best for you. Grains Baked goods made with fat, such as croissants, muffins, or some breads. Dry pasta or rice meal packs. Vegetables Creamed or fried vegetables. Vegetables in a cheese sauce. Regular canned vegetables (not low-sodium or reduced-sodium). Regular canned tomato sauce and paste (not low-sodium or reduced-sodium). Regular tomato and vegetable juice (not low-sodium or reduced-sodium). Angie Fava. Olives. Fruits Canned fruit in a light or heavy syrup. Fried fruit. Fruit in cream or butter sauce. Meat and other protein foods Fatty cuts of meat. Ribs. Fried meat. Berniece Salines. Sausage. Bologna and other processed lunch meats. Salami. Fatback. Hotdogs. Bratwurst. Salted nuts and seeds. Canned beans with added salt. Canned or smoked fish. Whole eggs or egg yolks. Chicken or [place] with skin. Dairy Whole or 2% milk,  cream, and half-and-half. Whole or full-fat cream cheese. Whole-fat or sweetened yogurt. Full-fat cheese. Nondairy creamers. Whipped toppings. Processed cheese and cheese spreads. Fats and oils Butter. Stick margarine. Lard. Shortening. Ghee. Bacon fat. Tropical oils, such as coconut, palm kernel, or palm oil. Seasoning and other foods Salted popcorn and pretzels. Onion salt, garlic salt, seasoned salt, table salt, and sea salt. Worcestershire sauce. Tartar sauce. Barbecue sauce. Teriyaki sauce. Soy sauce, including reduced-sodium. Steak sauce. Canned and packaged gravies. Fish sauce. Oyster sauce. Cocktail sauce. Horseradish that you find on the shelf. Ketchup. Mustard. Meat flavorings and tenderizers. Bouillon cubes. Hot sauce and Tabasco sauce. Premade or packaged marinades. Premade or packaged taco seasonings. Relishes. Regular salad dressings. Where to find more information:  National Heart, Lung, and [place]: https://wilson-eaton.com/  American Heart Association: www.heart.org Summary  The DASH eating plan is a healthy eating plan that has been shown to reduce high blood pressure (hypertension). It may also reduce your risk for type 2 diabetes, heart disease, and stroke.  With the DASH eating plan, you should limit salt (sodium) intake to 2,300 mg a day. If you have hypertension, you may need to reduce your sodium intake to 1,500 mg a day.  When on the DASH eating plan, aim to eat more fresh fruits and vegetables, whole grains, lean  proteins, low-fat dairy, and heart-healthy fats.  Work with your health care provider or diet and nutrition specialist (dietitian) to adjust your eating plan to your individual calorie needs. This information is not intended to replace advice given to you by your health care provider. Make sure you discuss any questions you have with your health care provider. Document Released: 12/27/2010 Document Revised: 01/01/2016 Document Reviewed: 01/01/2016 Elsevier  Interactive Patient Education  [employer].

## 2017-04-01 LAB — COMPREHENSIVE METABOLIC PANEL
ALBUMIN: 4.8 g/dL (ref 3.5–5.5)
ALT: 58 IU/L — ABNORMAL HIGH (ref 0–44)
AST: 32 IU/L (ref 0–40)
Albumin/Globulin Ratio: 2 (ref 1.2–2.2)
Alkaline Phosphatase: 66 IU/L (ref 39–117)
BUN/Creatinine Ratio: 14 (ref 9–20)
BUN: 12 mg/dL (ref 6–24)
Bilirubin Total: 0.8 mg/dL (ref 0.0–1.2)
CO2: 21 mmol/L (ref 20–29)
CREATININE: 0.86 mg/dL (ref 0.76–1.27)
Calcium: 10 mg/dL (ref 8.7–10.2)
Chloride: 102 mmol/L (ref 96–106)
GFR calc Af Amer: 115 mL/min/{1.73_m2} (ref >59)
GFR calc non Af Amer: 100 mL/min/{1.73_m2} (ref >59)
GLUCOSE: 101 mg/dL — AB (ref 65–99)
Globulin, Total: 2.4 g/dL (ref 1.5–4.5)
Potassium: 5.1 mmol/L (ref 3.5–5.2)
Sodium: 141 mmol/L (ref 134–144)
TOTAL PROTEIN: 7.2 g/dL (ref 6.0–8.5)

## 2017-04-16 ENCOUNTER — Ambulatory Visit: Payer: BLUE CROSS/BLUE SHIELD | Admitting: Family Medicine

## 2017-04-16 ENCOUNTER — Encounter: Payer: Self-pay | Admitting: Family Medicine

## 2017-04-16 VITALS — BP 140/90 | HR 55 | Temp 97.6°F | Wt 188.2 lb

## 2017-04-16 DIAGNOSIS — R351 Nocturia: Secondary | ICD-10-CM | POA: Diagnosis not present

## 2017-04-16 DIAGNOSIS — R350 Frequency of micturition: Secondary | ICD-10-CM | POA: Diagnosis not present

## 2017-04-16 DIAGNOSIS — R1030 Lower abdominal pain, unspecified: Secondary | ICD-10-CM

## 2017-04-16 DIAGNOSIS — R3911 Hesitancy of micturition: Secondary | ICD-10-CM | POA: Diagnosis not present

## 2017-04-16 LAB — POCT URINALYSIS DIP (PROADVANTAGE DEVICE)
Bilirubin, UA: NEGATIVE
GLUCOSE UA: NEGATIVE mg/dL
Ketones, POC UA: NEGATIVE mg/dL
Leukocytes, UA: NEGATIVE
NITRITE UA: NEGATIVE
PH UA: 7 (ref 5.0–8.0)
PROTEIN UA: NEGATIVE mg/dL
RBC UA: NEGATIVE
SPECIFIC GRAVITY, URINE: 1.02
UUROB: NEGATIVE

## 2017-04-16 MED ORDER — TAMSULOSIN HCL 0.4 MG PO CAPS
0.4000 mg | ORAL_CAPSULE | Freq: Every day | ORAL | 1 refills | Status: DC
Start: 2017-04-16 — End: 2017-05-05

## 2017-04-16 MED ORDER — SULFAMETHOXAZOLE-TRIMETHOPRIM 800-160 MG PO TABS
1.0000 | ORAL_TABLET | Freq: Two times a day (BID) | ORAL | 0 refills | Status: DC
Start: 2017-04-16 — End: 2017-05-05

## 2017-04-16 NOTE — Patient Instructions (Addendum)
Start taking the tamsulosin once daily.  Take the antibiotic as prescribed for the next 10 days. Follow-up with me in 2 weeks to see how you are doing.  Keep an eye on your blood pressure at home since you have reduced your dose of losartan.  Your blood pressure should be less than 130/80 on a consistent basis to be well controlled.

## 2017-04-16 NOTE — Progress Notes (Signed)
Subjective:    Patient ID: Shawn MccallumGlen Ramirez, male    DOB: 06/24/1965, 52 y.o.   MRN: [id number]  HPI Chief Complaint  Patient presents with  . abdominal pain    abdominal pain, pain in groin area- feels like a knot- symptoms started a long time ago but getting worse   He is here with complaints of a several week history of intermittent pain in his lower abdomen that is present only when his bladder is full. States pain is relieved with urination. He also reports having hesitency and does not feel like he is emptying his bladder. He also notes leakage after voiding and frequent urination.   Nocturia- almost every hour for several months. This is gradually worsening.  Denies history of prostatitis or BPH.   He also complains of intermittent right lower groin pain that has been ongoing for over a year. Pain is worse with sitting and relieved with standing. No pain with exertion or straining.   States he reduced his losartan to 50 mg and his BP has been normal on this dose.   Denies fever, chills, dizziness, chest pain, palpitations, shortness of breath, cough, N/V/D.   Reviewed allergies, medications, past medical, surgical, family, and social history.    Review of Systems Pertinent positives and negatives in the history of present illness.     Objective:   Physical Exam  Constitutional: He is oriented to person, place, and time. He appears well-developed and well-nourished. No distress.  Cardiovascular: Normal rate and regular rhythm.  Pulmonary/Chest: Effort normal and breath sounds normal.  Abdominal: Soft. Normal appearance and bowel sounds are normal. There is no hepatosplenomegaly. There is no tenderness. There is no rigidity, no rebound, no guarding, no CVA tenderness, no tenderness at McBurney's point and negative Murphy's sign. Hernia confirmed negative in the right inguinal area and confirmed negative in the left inguinal area.  Genitourinary: Rectum normal, testes normal and  penis normal. Rectal exam shows no tenderness and guaiac negative stool. Prostate is not enlarged and not tender.  Genitourinary Comments: Prostate is slightly boggy  Lymphadenopathy:       Right: No inguinal adenopathy present.       Left: No inguinal adenopathy present.  Neurological: He is alert and oriented to person, place, and time. He has normal strength. No cranial nerve deficit or sensory deficit.  Skin: Skin is warm and dry. No pallor.  Psychiatric: He has a normal mood and affect. His speech is normal and behavior is normal.   BP 140/90   Pulse (!) 55   Temp 97.6 F (36.4 C) (Oral)   Wt 188 lb 3.2 oz (85.4 kg)   SpO2 98%   BMI 28.62 kg/m       Assessment & Plan:  Urinary frequency - Plan: sulfamethoxazole-trimethoprim (BACTRIM DS,SEPTRA DS) 800-160 MG tablet, tamsulosin (FLOMAX) 0.4 MG CAPS capsule, Urine Culture  Lower abdominal pain - Plan: POCT Urinalysis DIP (Proadvantage Device), Urine Culture  Urinary hesitancy - Plan: sulfamethoxazole-trimethoprim (BACTRIM DS,SEPTRA DS) 800-160 MG tablet, tamsulosin (FLOMAX) 0.4 MG CAPS capsule, Urine Culture  Nocturia - Plan: sulfamethoxazole-trimethoprim (BACTRIM DS,SEPTRA DS) 800-160 MG tablet, Urine Culture  UA dipstick is negative.  Will send for culture. Prostate is slightly boggy.  Plan to treat with Bactrim and Flomax and see if his urinary symptoms improve over the next couple of weeks.  Discussed that his PSA was normal in January.  I will have him follow-up in 2 weeks.  He is aware that his blood pressure  is elevated today.  He decreased his dose of blood pressure medication and has been checking it at home.  He is aware that he will need to keep an eye on his blood pressure and I did advise him to avoid adjusting his medications without discussing it with me. If if symptoms do not improve over the next 2 weeks, we will refer him to him to urology.

## 2017-04-17 LAB — URINE CULTURE

## 2017-05-05 ENCOUNTER — Encounter: Payer: Self-pay | Admitting: Family Medicine

## 2017-05-05 ENCOUNTER — Ambulatory Visit: Payer: BLUE CROSS/BLUE SHIELD | Admitting: Family Medicine

## 2017-05-05 ENCOUNTER — Telehealth: Payer: Self-pay

## 2017-05-05 VITALS — BP 120/70 | HR 69 | Temp 97.5°F | Resp 16 | Ht 68.0 in | Wt 185.6 lb

## 2017-05-05 DIAGNOSIS — R350 Frequency of micturition: Secondary | ICD-10-CM | POA: Diagnosis not present

## 2017-05-05 DIAGNOSIS — R197 Diarrhea, unspecified: Secondary | ICD-10-CM

## 2017-05-05 DIAGNOSIS — R1011 Right upper quadrant pain: Secondary | ICD-10-CM

## 2017-05-05 LAB — POCT URINALYSIS DIP (PROADVANTAGE DEVICE)
Bilirubin, UA: NEGATIVE
Glucose, UA: NEGATIVE mg/dL
Ketones, POC UA: NEGATIVE mg/dL
Leukocytes, UA: NEGATIVE
NITRITE UA: NEGATIVE
PH UA: 7 (ref 5.0–8.0)
PROTEIN UA: NEGATIVE mg/dL
RBC UA: NEGATIVE
SPECIFIC GRAVITY, URINE: 1.015
UUROB: NEGATIVE

## 2017-05-05 NOTE — Telephone Encounter (Signed)
I have faxed over referral to GI

## 2017-05-05 NOTE — Telephone Encounter (Signed)
Patient has called to inform us that he can be referred [facility] to see Dr. Loreta AveMann or Dr. Elnoria HowardHung. Please place referral for GI.

## 2017-05-05 NOTE — Telephone Encounter (Signed)
Please take care of this.  

## 2017-05-05 NOTE — Progress Notes (Signed)
   Subjective:    Patient ID: Shawn MccallumGlen Ramirez, male    DOB: 05/02/1965, 52 y.o.   MRN: [id number]  HPI Chief Complaint  Patient presents with  . 2 week follow-up    2 week follow-up on urine frequency. thinks it was internal hemorrhoids so he got suppositories    He is here for a 2 week follow up on urinary symptoms. He completed the antibiotic and states he no longer has groin pain or urinary frequency.   States he thinks his urinary symptoms were related to internal hemorrhoids. States his wife told him this and had him try OTC suppositories and his pain has resolved.  States he continues having intermittent right upper quadrant pain however along with intermittent diarrhea that it worse with certain foods. RUQ pain seems to be unrelated to eating.  He has been referred to GI for this in the past but has not been. He does want to pursue this now.   History of elevated liver enzymes and he is concerned about this. We have discussed this on many occasions and how to prevent worsening liver function. He has stopped drinking alcohol.   Denies fever, chills, dizziness, chest pain, palpitations, shortness of breath,  N/V, urinary symptoms, LE edema.   Reviewed allergies, medications, past medical, surgical, family, and social history.    Review of Systems Pertinent positives and negatives in the history of present illness.     Objective:   Physical Exam BP 120/70   Pulse 69   Temp (!) 97.5 F (36.4 C) (Oral)   Resp 16   Ht 5\' 8"  (1.727 m)   Wt 185 lb 9.6 oz (84.2 kg)   SpO2 97%   BMI 28.22 kg/m   Alert and oriented and in no acute distress. Not otherwise examined. Declines GU exam.       Assessment & Plan:  Urinary frequency  RUQ abdominal pain - Plan: POCT Urinalysis DIP (Proadvantage Device), Ambulatory referral to Gastroenterology  Intermittent diarrhea - Plan: Ambulatory referral to Gastroenterology  Discussed that his urinalysis dipstick was negative on 04/16/2017 and is  negative again today. No longer having groin pain or frequency.  He has not noticed any changes with urinary symptoms so we will have him stop the Flomax for now.  Recommend he see GI for ongoing intermittent RUQ pain. He is in agreement.

## 2017-05-05 NOTE — Patient Instructions (Signed)
Stop the Flomax.   Your urine looks fine today.   Call and schedule with your gastroenterologist as discussed for ongoing intermittent right upper abdominal pain and intermittent diarrhea.

## 2017-05-14 DIAGNOSIS — R197 Diarrhea, unspecified: Secondary | ICD-10-CM | POA: Diagnosis not present

## 2017-05-14 DIAGNOSIS — K760 Fatty (change of) liver, not elsewhere classified: Secondary | ICD-10-CM | POA: Diagnosis not present

## 2017-05-14 DIAGNOSIS — R740 Nonspecific elevation of levels of transaminase and lactic acid dehydrogenase [LDH]: Secondary | ICD-10-CM | POA: Diagnosis not present

## 2017-05-14 DIAGNOSIS — R1011 Right upper quadrant pain: Secondary | ICD-10-CM | POA: Diagnosis not present

## 2017-05-22 DIAGNOSIS — R1011 Right upper quadrant pain: Secondary | ICD-10-CM | POA: Diagnosis not present

## 2017-05-22 DIAGNOSIS — K3189 Other diseases of stomach and duodenum: Secondary | ICD-10-CM | POA: Diagnosis not present

## 2017-05-22 DIAGNOSIS — K319 Disease of stomach and duodenum, unspecified: Secondary | ICD-10-CM | POA: Diagnosis not present

## 2017-05-22 DIAGNOSIS — Z1211 Encounter for screening for malignant neoplasm of colon: Secondary | ICD-10-CM | POA: Diagnosis not present

## 2017-05-22 DIAGNOSIS — R197 Diarrhea, unspecified: Secondary | ICD-10-CM | POA: Diagnosis not present

## 2017-05-22 LAB — HM COLONOSCOPY

## 2017-07-09 ENCOUNTER — Encounter: Payer: Self-pay | Admitting: Family Medicine

## 2017-07-09 ENCOUNTER — Ambulatory Visit: Payer: BLUE CROSS/BLUE SHIELD | Admitting: Family Medicine

## 2017-07-09 VITALS — BP 120/70 | HR 76 | Temp 97.8°F | Wt 176.6 lb

## 2017-07-09 DIAGNOSIS — R748 Abnormal levels of other serum enzymes: Secondary | ICD-10-CM | POA: Diagnosis not present

## 2017-07-09 DIAGNOSIS — I10 Essential (primary) hypertension: Secondary | ICD-10-CM

## 2017-07-09 DIAGNOSIS — H6692 Otitis media, unspecified, left ear: Secondary | ICD-10-CM | POA: Diagnosis not present

## 2017-07-09 MED ORDER — LEVOFLOXACIN 500 MG PO TABS: 500.0000 mg | ORAL_TABLET | Freq: Every day | ORAL | 0 refills | Status: DC

## 2017-07-09 MED ORDER — CIPROFLOXACIN-DEXAMETHASONE 0.3-0.1 % OT SUSP: 4.0000 [drp] | Freq: Two times a day (BID) | OTIC | 0 refills | Status: DC

## 2017-07-09 NOTE — Progress Notes (Signed)
   Subjective:    Patient ID: Shawn Ramirez, male    DOB: 01/29/1965, 52 y.o.   MRN: [id number]  HPI Chief Complaint  Patient presents with  . 3 month follow-up    follow-up on LFT  . ear pain    ear pain for the last 2 weeks. getting worse   He is here to follow up on elevated ALT. He has been concerned about this and the fact that he has fatty liver disease.  He saw Dr. Elnoria HowardHung and states he had an EGD and colonoscopy. States he was told everything was fine.   Stopped his BP medication and has changed his diet and has lost weight. States he is eating very healthy now and has lost approximately 30 lbs. BP is in goal range at home.   He has a new complaint today of left ear pain x 2 weeks. Reports having chronic tinnitus but this is worsening. He also reports decreased hearing in his left ear. He has been swimming. Denies history of ear infections.  Denies fever, chills, headache, vision changes, rhinorrhea, nasal congestion, sinus pressure, sore throat, cough, N/V/D.   Reviewed allergies, medications, past medical, surgical, family, and social history.   Review of Systems Pertinent positives and negatives in the history of present illness.     Objective:   Physical Exam BP 120/70   Pulse 76   Temp 97.8 F (36.6 C) (Oral)   Wt 176 lb 9.6 oz (80.1 kg)   BMI 26.85 kg/m   Alert and in no distress. No sinus tenderness. Nares patent without erythema, edema or drainage. Left TM unable to visualize but he does have yellowish exudate with questionable perforation, canal is mildly edematous. No drainage. No mastoid tenderness. Decreased hearing in left ear.  Right TM and canal is normal.  Pharyngeal area is normal. Neck is supple without adenopathy or thyromegaly. Cardiac exam shows a regular sinus rhythm without murmurs or gallops. Lungs are clear to auscultation.       Assessment & Plan:  Elevated liver enzymes  Essential hypertension  Acute otitis media, left - Plan:  ciprofloxacin-dexamethasone (CIPRODEX) OTIC suspension, levofloxacin (LEVAQUIN) 500 MG tablet  Discussed that we have been closely monitoring his LFTs and they have been stable. We will recheck them in 3 months.  Continue eating healthy foods and avoiding alcohol.  Will get records from GI office.  BP is at goal and no medications. Diet controlled for now. He will keep an eye on this.  Levofloxacin and Ciprodex suspension prescribed for acute otitis media with questionable otitis externa and possible perforation. He may take Ibuprofen for pain. Follow up next week or sooner if symptoms worsen.

## 2017-07-09 NOTE — Patient Instructions (Signed)
Take the oral antibiotic and use the topical drops as well.  Return next week for a follow up.   If you develop fever, worsening pain or any other symptoms then let me know.   Otitis Media, Adult Otitis media is redness, soreness, and puffiness (swelling) in the space just behind your eardrum (middle ear). It may be caused by allergies or infection. It often happens along with a cold. Follow these instructions at home:  Take your medicine as told. Finish it even if you start to feel better.  Only take over-the-counter or prescription medicines for pain, discomfort, or fever as told by your doctor.  Follow up with your doctor as told. Contact a doctor if:  You have otitis media only in one ear, or bleeding from your nose, or both.  You notice a lump on your neck.  You are not getting better in 3-5 days.  You feel worse instead of better. Get help right away if:  You have pain that is not helped with medicine.  You have puffiness, redness, or pain around your ear.  You get a stiff neck.  You cannot move part of your face (paralysis).  You notice that the bone behind your ear hurts when you touch it. This information is not intended to replace advice given to you by your health care provider. Make sure you discuss any questions you have with your health care provider. Document Released: 06/26/2007 Document Revised: 06/15/2015 Document Reviewed: 08/04/2012 Elsevier Interactive Patient Education  2017 [employer]

## 2017-07-16 ENCOUNTER — Encounter: Payer: Self-pay | Admitting: Family Medicine

## 2017-07-16 ENCOUNTER — Ambulatory Visit: Payer: BLUE CROSS/BLUE SHIELD | Admitting: Family Medicine

## 2017-07-16 VITALS — BP 124/86 | HR 63 | Temp 97.9°F | Ht 68.0 in | Wt 174.8 lb

## 2017-07-16 DIAGNOSIS — H6122 Impacted cerumen, left ear: Secondary | ICD-10-CM | POA: Diagnosis not present

## 2017-07-16 DIAGNOSIS — H9192 Unspecified hearing loss, left ear: Secondary | ICD-10-CM

## 2017-07-16 NOTE — Progress Notes (Signed)
   Subjective:    Patient ID: Shawn Ramirez, male    DOB: 09/05/1965, 52 y.o.   MRN: [id number]  HPI Chief Complaint  Patient presents with  . Follow-up    Ear infection not better (left)   He is here to follow up on left ear infection.  Completed course of Levaquin and Ciprodex. He reports no improvement in hearing, tinnitus. No pain.  Denies fever, chills, headache, dizziness, rhinorrhea, nasal congestion, sore throat, cough.   Reviewed allergies, medications, past medical, surgical, family, and social history.    Review of Systems Pertinent positives and negatives in the history of present illness.     Objective:   Physical Exam BP 124/86   Pulse 63   Temp 97.9 F (36.6 C) (Oral)   Ht 5\' 8"  (1.727 m)   Wt 174 lb 12.8 oz (79.3 kg)   SpO2 98%   BMI 26.58 kg/m   Alert and in no distress. No sinus tenderness. Nares patent, no edema, erythema or drainage. Unable to visualize Left TM, left canal is occluded with a white thick substance, no longer edematous or tender. Right TM and canal normal. Pharyngeal area is normal. Neck is supple without adenopathy or thyromegaly.        Assessment & Plan:  Decreased hearing of left ear - Plan: Ambulatory referral to ENT  Impacted cerumen of left ear - Plan: Ambulatory referral to ENT  Left ear canal does appear to be improved however he denies any improvement in tinnitus or hearing.  No new symptoms. Attempted left ear lavage due to impacted canal but unable to successfully disimpact. Irrigation and curette was used. He began having discomfort with irrigation so the procedure was stopped.  He will be referred to ENT for further evaluation and treatment.

## 2017-07-17 DIAGNOSIS — J343 Hypertrophy of nasal turbinates: Secondary | ICD-10-CM | POA: Diagnosis not present

## 2017-07-17 DIAGNOSIS — T162XXA Foreign body in left ear, initial encounter: Secondary | ICD-10-CM | POA: Diagnosis not present

## 2018-12-07 ENCOUNTER — Encounter: Payer: Self-pay | Admitting: Internal Medicine

## 2018-12-15 ENCOUNTER — Other Ambulatory Visit: Payer: Self-pay

## 2019-11-05 IMAGING — US US ABDOMEN LIMITED
1 series · 14 of 25 positions shown · non-contrast
Comparison: None in PACs

CLINICAL DATA: Elevated liver function studies, 2 months of right
upper quadrant pain.

EXAM:
ULTRASOUND ABDOMEN LIMITED RIGHT UPPER QUADRANT

[Series 1: us abdomen limited · 0.22mm/px · 14 of 52 slices shown]
[im 1/52]
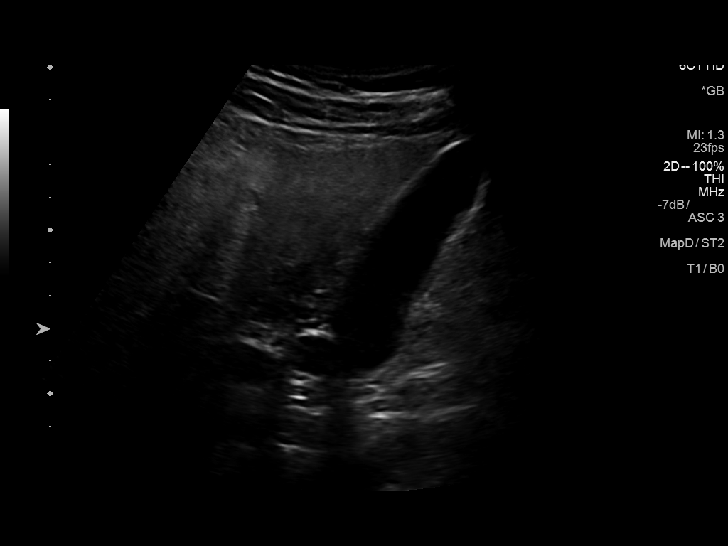
[im 5/52]
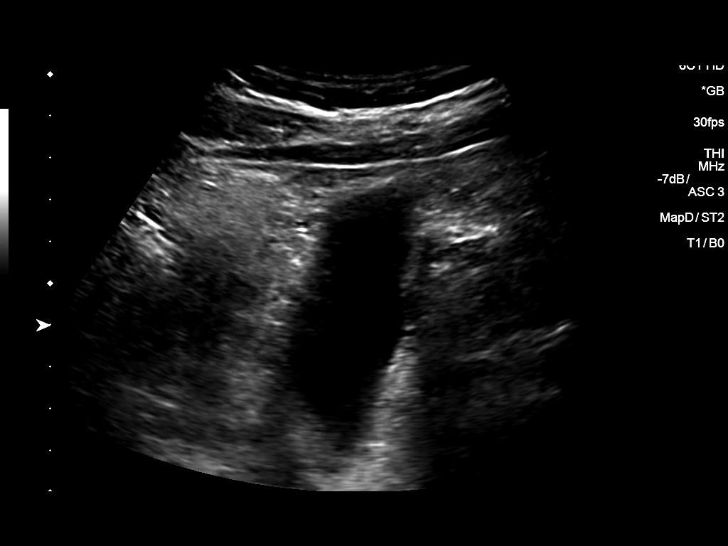
[im 9/52]
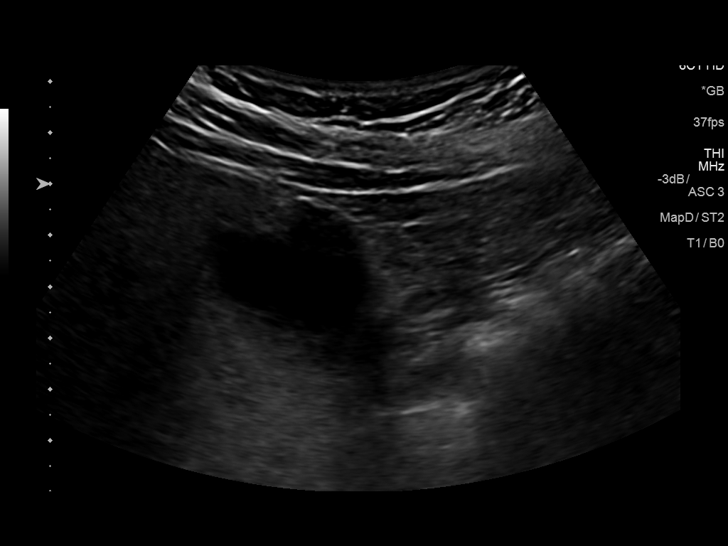
[im 13/52]
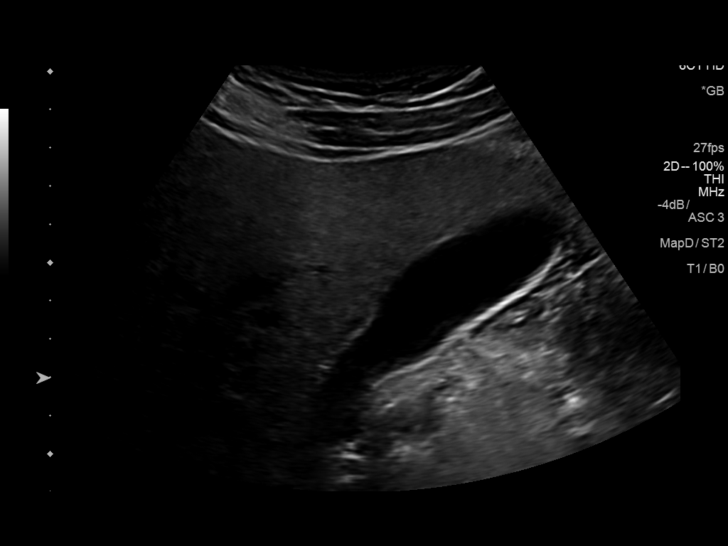
[im 18/52]
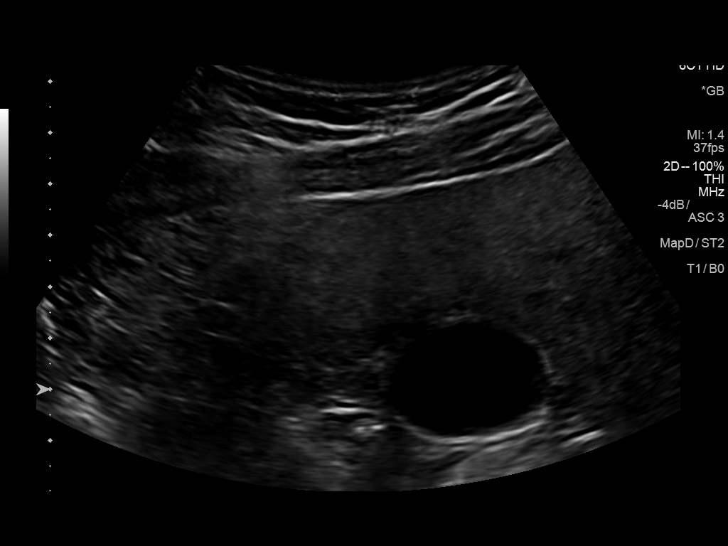
[im 20/52]
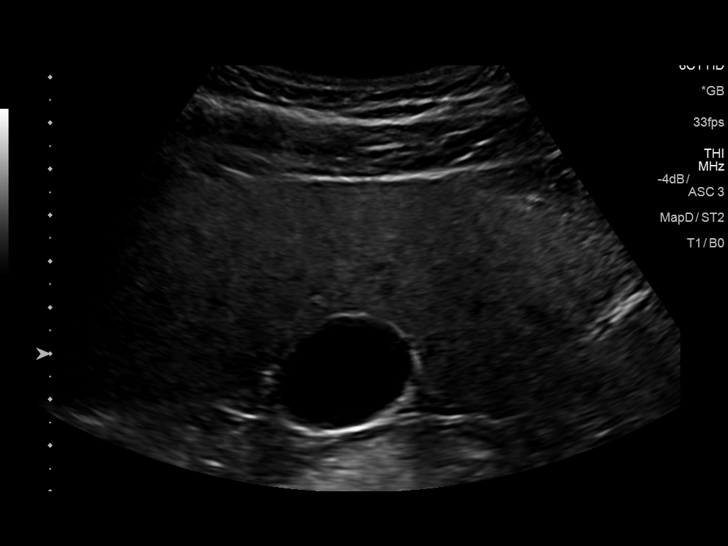
[im 24/52]
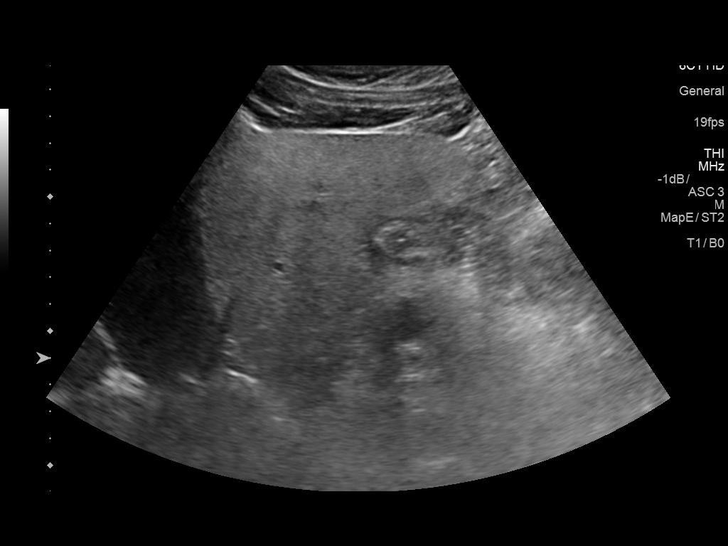
[im 28/52]
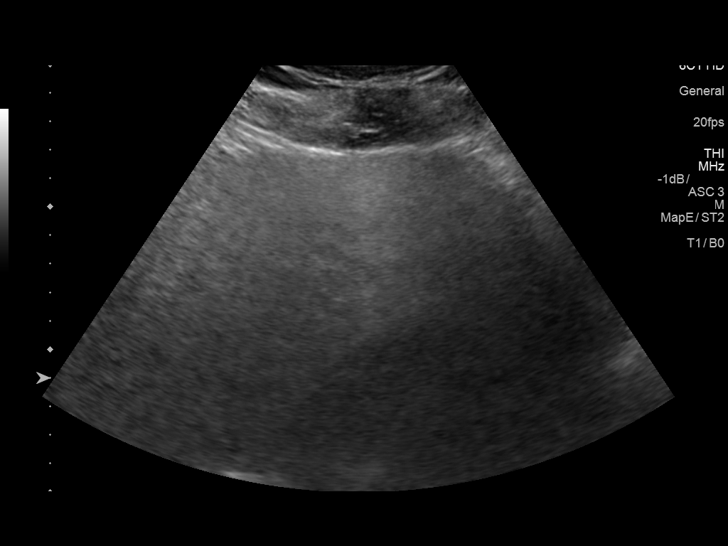
[im 32/52]
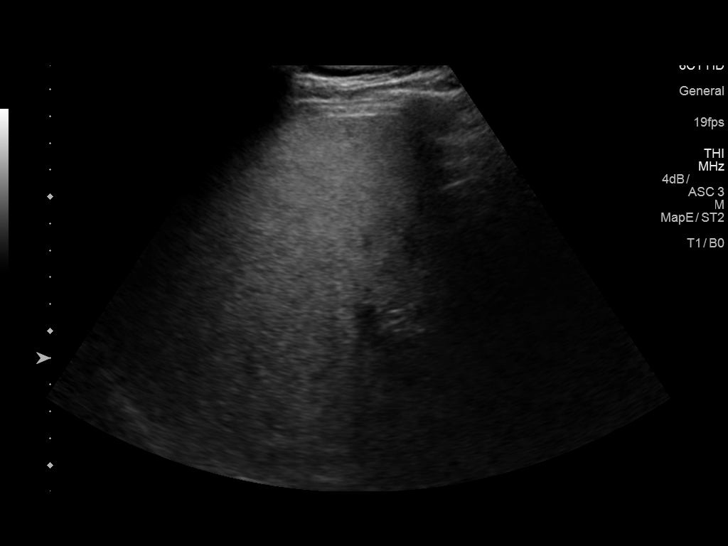
[im 35/52]
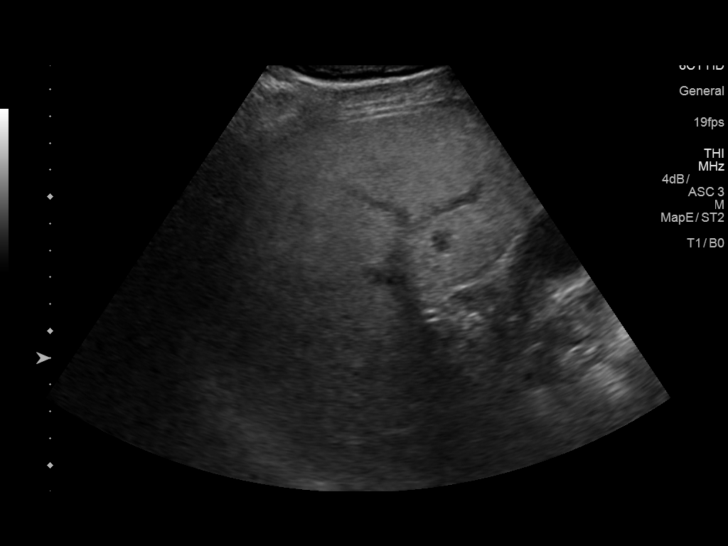
[im 39/52]
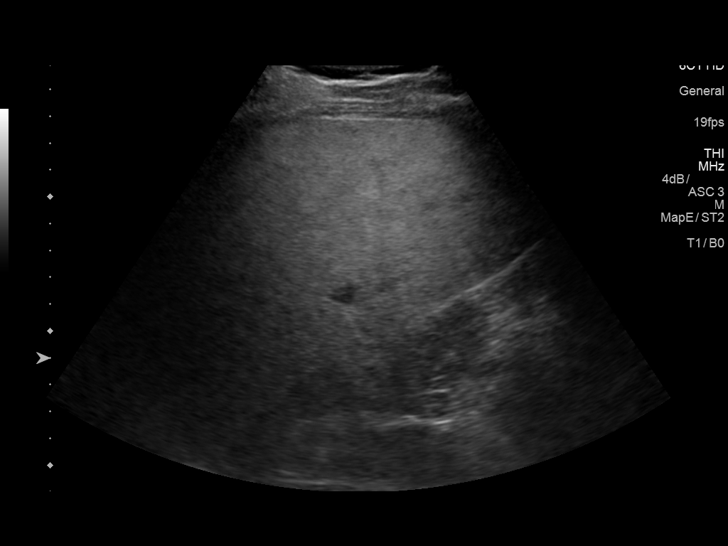
[im 43/52]
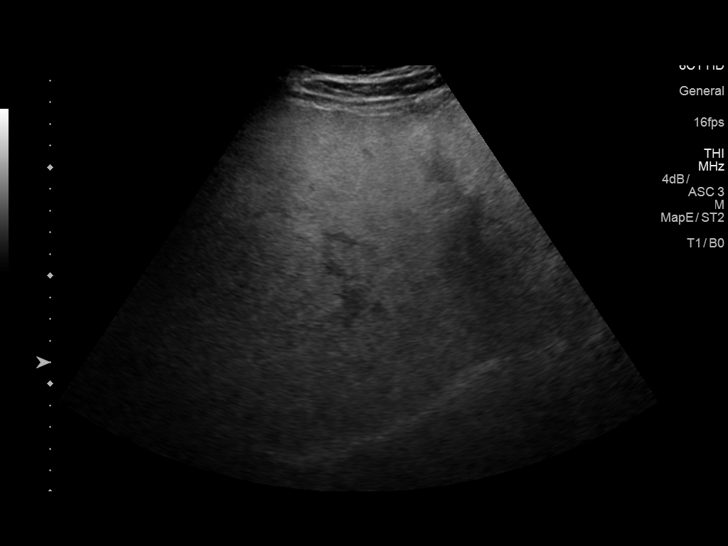
[im 47/52]
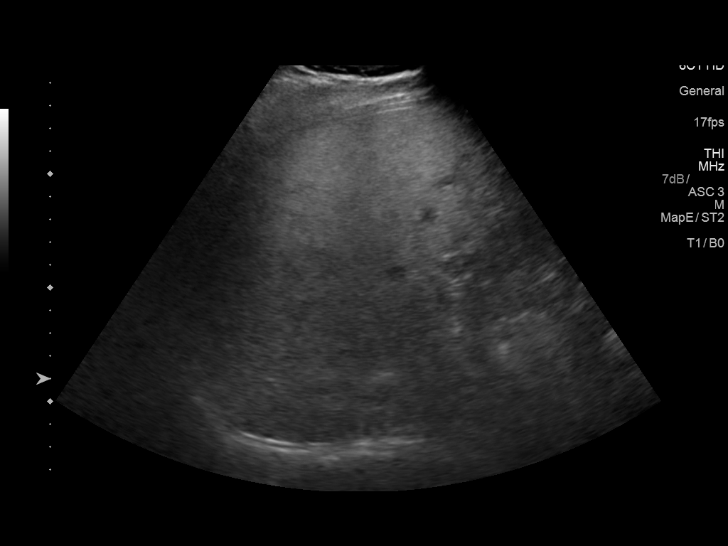
[im 52/52]
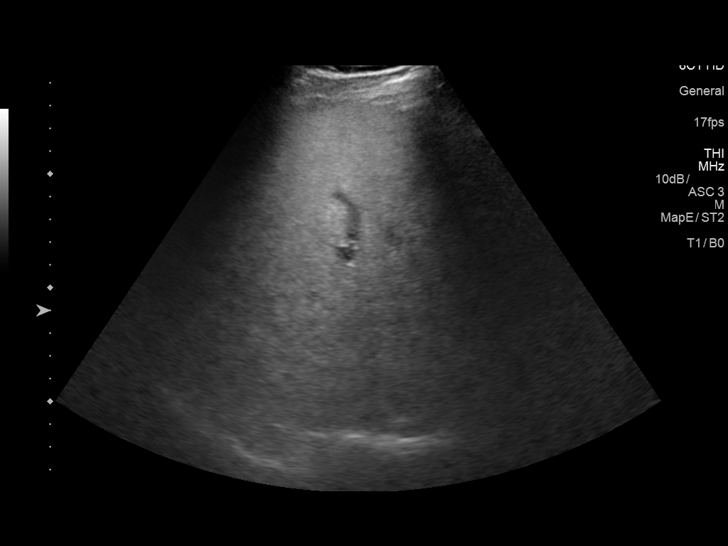

[14 of 25 positions shown; findings below may reference images not displayed]

FINDINGS: Gallbladder:

No gallstones or wall thickening visualized. No sonographic Murphy
sign noted by sonographer.

Common bile duct:

Diameter: 4.5 mm

Liver:

The hepatic echotexture is increased. The surface contour is smooth.
There is no focal mass or ductal dilation. Portal vein is patent on
color Doppler imaging with normal direction of blood flow towards
the liver.
IMPRESSION: Increased hepatic echotexture most compatible with fatty
infiltrative change.

No gallstones or sonographic evidence of acute cholecystitis. If
there are clinical concerns of chronic cholecystitis, a nuclear
medicine hepatobiliary scan with gallbladder ejection fraction
determination may be useful.

## 2020-06-14 ENCOUNTER — Other Ambulatory Visit: Payer: Self-pay

## 2020-06-14 ENCOUNTER — Telehealth: Payer: No Typology Code available for payment source | Admitting: Family Medicine

## 2020-06-14 ENCOUNTER — Encounter: Payer: Self-pay | Admitting: Family Medicine

## 2020-06-14 ENCOUNTER — Telehealth: Payer: Self-pay | Admitting: Family Medicine

## 2020-06-14 DIAGNOSIS — R059 Cough, unspecified: Secondary | ICD-10-CM

## 2020-06-14 DIAGNOSIS — U071 COVID-19: Secondary | ICD-10-CM | POA: Diagnosis not present

## 2020-06-14 NOTE — Telephone Encounter (Signed)
He was NOT seen by me.  I only saw the wife (he was at home when I had visit with wife, and in getting history, he was sick prior to the patient). Sending to [facility], unsure if she would want to do a virtual visit, or if comfortable advising when to return to work (see Barbara's last message and OV).

## 2020-06-14 NOTE — Telephone Encounter (Signed)
He will need a virtual for me to determine return to work and write a note since I have not seen him at all for this.

## 2020-06-14 NOTE — Telephone Encounter (Signed)
Pt doing a virtual today

## 2020-06-14 NOTE — Telephone Encounter (Signed)
Pt wife  Called for pt and states that he also needs a return to work note for his positive covid that he had, he had a positive covid test also, since he was in close contact with pt, and just has a mild cough  Per sabrina I should send it to you, since Britta Mccreedy was seen by you Pt can be reached at [phone]

## 2020-06-14 NOTE — Progress Notes (Signed)
   Subjective:  Documentation for virtual audio and video telecommunications through Caregility encounter: Video did not work so we had to complete the visit via telephone.   The patient was located at home. 2 patient identifiers used.  The provider was located in the office. The patient did consent to this visit and is aware of possible charges through their insurance for this visit.  The other persons participating in this telemedicine service were none. Time spent on call was 12 minutes and in review of previous records >15 minutes total.  This virtual service is not related to other E/M service within previous 7 days.   Patient ID: Shawn Ramirez, male    DOB: November 08, 1965, 55 y.o.   MRN: [id number]  HPI Chief Complaint  Patient presents with  . work note    Needs work note to return to work. Symptoms started 17th, fever, body aches, headache and some cough, tested positive 19th and no symptoms now except still little cough, took home test today and negative   States he became symptomatic on 06/06/2020 and tested positive on 06/08/2020.   Reports he currently has a mild cough with occasional clear sputum.   Negative home test this morning per patient.   No fever in 3 days and is not taking an antipyretic. Denies chest pain, palpitations, shortness of breath, abdominal pain, N/V/D.   No new concerns or complaints.     Review of Systems Pertinent positives and negatives in the history of present illness.     Objective:   Physical Exam There were no vitals taken for this visit.  Alert and oriented in no acute distress.  Respirations unlabored.  Normal speech      Assessment & Plan:  COVID-19 virus infection  Cough  He appears to be recovering well and no fever in 3 days.  I will write a return to work note for him as requested.  He will let me know if he develops any new or worsening symptoms.

## 2020-06-15 ENCOUNTER — Telehealth: Payer: BLUE CROSS/BLUE SHIELD | Admitting: Family Medicine

## 2021-03-08 ENCOUNTER — Ambulatory Visit: Payer: No Typology Code available for payment source | Admitting: Physician Assistant

## 2021-03-08 ENCOUNTER — Encounter: Payer: Self-pay | Admitting: Physician Assistant

## 2021-03-08 ENCOUNTER — Other Ambulatory Visit: Payer: Self-pay

## 2021-03-08 VITALS — BP 130/84 | HR 78 | Temp 98.5°F | Ht 68.0 in | Wt 180.2 lb

## 2021-03-08 DIAGNOSIS — R300 Dysuria: Secondary | ICD-10-CM | POA: Diagnosis not present

## 2021-03-08 DIAGNOSIS — K760 Fatty (change of) liver, not elsewhere classified: Secondary | ICD-10-CM

## 2021-03-08 DIAGNOSIS — R350 Frequency of micturition: Secondary | ICD-10-CM | POA: Diagnosis not present

## 2021-03-08 DIAGNOSIS — R030 Elevated blood-pressure reading, without diagnosis of hypertension: Secondary | ICD-10-CM

## 2021-03-08 DIAGNOSIS — E739 Lactose intolerance, unspecified: Secondary | ICD-10-CM

## 2021-03-08 DIAGNOSIS — I10 Essential (primary) hypertension: Secondary | ICD-10-CM | POA: Insufficient documentation

## 2021-03-08 LAB — POCT URINALYSIS DIP (CLINITEK)
Bilirubin, UA: NEGATIVE
Blood, UA: NEGATIVE
Glucose, UA: NEGATIVE mg/dL
Ketones, POC UA: NEGATIVE mg/dL
Leukocytes, UA: NEGATIVE
Nitrite, UA: NEGATIVE
POC PROTEIN,UA: NEGATIVE
Spec Grav, UA: 1.01 (ref 1.010–1.025)
Urobilinogen, UA: 0.2 E.U./dL
pH, UA: 7 (ref 5.0–8.0)

## 2021-03-08 NOTE — Progress Notes (Signed)
Acute Office Visit  Subjective:    Patient ID: Shawn Ramirez, male    DOB: [birth year], 56 y.o.   MRN: [id number]  Chief Complaint  Patient presents with   other    Frequent urination mostly at night gets up 1-2 times at night, and BP has been up recently.     HPI Patient is in today for a follow up appointment.   Reports when he made this appointment he had constipation, but has been eating prunes and now it's better.   States he doesn't check blood pressure at home; has been prescribed losartan and hctz in the past, but after losing weight, doesn't take either medicine any more.   States he has decreased the amount of sugar he eats and drinks after being diagnosed with fatty liver. Also avoids some dairy foods with a lot of sugar because it upsets his stomach.     Past Medical History:  Diagnosis Date   Abnormal echocardiogram 11/28/2016   Per records from [place]. Mild AR/TR, EF 60%.   Aortic insufficiency 11/28/2016   Elevated liver enzymes 11/28/2016   Per records 10/2015. AST 43, ALT 76   History of cystic acne 11/28/2016   HTN (hypertension)    IBS (irritable bowel syndrome) 11/28/2016   Mixed hyperlipidemia 11/28/2016   Neuromuscular disorder (HCC)    pinched nerve in back    Prediabetes 11/28/2016   10/2015 Hb A1c 5.9% per records    Past Surgical History:  Procedure Laterality Date   COLONOSCOPY  2008   normal per [place] in [place]    HERNIA REPAIR     WISDOM TOOTH EXTRACTION     no sedation     Family History  Problem Relation Age of Onset   Diabetes Mother    Hypertension Mother    Hypertension Father    Osteoporosis Father    Hypertension Sister    Hypertension Brother    Colon cancer Neg Hx    Colon polyps Neg Hx    Rectal cancer Neg Hx    Stomach cancer Neg Hx     Social History   Socioeconomic History   Marital status: Married    Spouse name: Not on file   Number of children: Not on file   Years of education: Not on file   Highest education level: Not on  file  Occupational History   Not on file  Tobacco Use   Smoking status: Former    Packs/day: 1.00    Years: 10.00    Pack years: 10.00    Types: Cigarettes    Quit date: 11/27/2005    Years since quitting: 15.2   Smokeless tobacco: Never  Substance and Sexual Activity   Alcohol use: No    Comment: stopped in November   Drug use: No   Sexual activity: Yes  Other Topics Concern   Not on file  Social History Narrative   Not on file   Social Determinants of Health   Financial Resource Strain: Not on file  Food Insecurity: Not on file  Transportation Needs: Not on file  Physical Activity: Not on file  Stress: Not on file  Social Connections: Not on file  Intimate Partner Violence: Not on file    Outpatient Medications Prior to Visit  Medication Sig Dispense Refill   losartan (COZAAR) 100 MG tablet Losartan     No facility-administered medications prior to visit.    Allergies  Allergen Reactions   Amoxicillin Rash   Penicillins Rash  Phenobarbital Rash    Review of Systems  Constitutional:  Negative for activity change, chills, fatigue and fever.  HENT:  Negative for congestion, ear pain, hearing loss and voice change.   Eyes:  Negative for pain and redness.  Respiratory:  Negative for cough and shortness of breath.   Cardiovascular:  Negative for leg swelling.  Gastrointestinal:  Negative for constipation, diarrhea, nausea and vomiting.  Endocrine: Negative for polyuria.  Genitourinary:  Negative for flank pain and frequency.  Musculoskeletal:  Negative for joint swelling and neck pain.  Skin:  Negative for rash.  Neurological:  Negative for dizziness.  Hematological:  Does not bruise/bleed easily.  Psychiatric/Behavioral:  Negative for agitation and behavioral problems.       Objective:    Physical Exam Constitutional:      General: He is not in acute distress.    Appearance: Normal appearance.  HENT:     Head: Normocephalic and atraumatic.     Right  Ear: External ear normal.     Left Ear: External ear normal.     Nose: No congestion.  Eyes:     Extraocular Movements: Extraocular movements intact.     Conjunctiva/sclera: Conjunctivae normal.     Pupils: Pupils are equal, round, and reactive to light.  Cardiovascular:     Rate and Rhythm: Normal rate and regular rhythm.     Pulses: Normal pulses.     Heart sounds: Normal heart sounds.  Pulmonary:     Effort: Pulmonary effort is normal.     Breath sounds: Normal breath sounds. No wheezing.  Abdominal:     General: Bowel sounds are normal.     Palpations: Abdomen is soft.  Musculoskeletal:        General: Normal range of motion.     Cervical back: Normal range of motion and neck supple.     Right lower leg: No edema.     Left lower leg: No edema.  Skin:    General: Skin is warm and dry.     Findings: No rash.  Neurological:     Mental Status: He is alert and oriented to person, place, and time.     Gait: Gait normal.  Psychiatric:        Mood and Affect: Mood normal.        Behavior: Behavior normal.    BP 130/84    Pulse 78    Temp 98.5 F (36.9 C)    Wt 180 lb 3.2 oz (81.7 kg)    BMI 27.40 kg/m   Wt Readings from Last 3 Encounters:  03/08/21 180 lb 3.2 oz (81.7 kg)  07/16/17 174 lb 12.8 oz (79.3 kg)  07/09/17 176 lb 9.6 oz (80.1 kg)    Health Maintenance Due  Topic Date Due   COVID-19 Vaccine (1) Never done   Zoster Vaccines- Shingrix (1 of 2) Never done   INFLUENZA VACCINE  08/21/2020   TETANUS/TDAP  01/21/2021    There are no preventive care reminders to display for this patient.   No results found for: TSH Lab Results  Component Value Date   WBC 6.7 11/27/2016   HGB 14.8 11/27/2016   HCT 43.0 11/27/2016   MCV 88.3 11/27/2016   PLT 270 11/27/2016   Lab Results  Component Value Date   NA 141 03/31/2017   K 5.1 03/31/2017   CO2 21 03/31/2017   GLUCOSE 101 (H) 03/31/2017   BUN 12 03/31/2017   CREATININE 0.86 03/31/2017   BILITOT  0.8 03/31/2017    ALKPHOS 66 03/31/2017   AST 32 03/31/2017   ALT 58 (H) 03/31/2017   PROT 7.2 03/31/2017   ALBUMIN 4.8 03/31/2017   CALCIUM 10.0 03/31/2017   Lab Results  Component Value Date   CHOL 172 01/22/2017   Lab Results  Component Value Date   HDL 41 01/22/2017   Lab Results  Component Value Date   LDLCALC 113 (H) 01/22/2017   Lab Results  Component Value Date   TRIG 82 01/22/2017   Lab Results  Component Value Date   CHOLHDL 4.2 01/22/2017   Lab Results  Component Value Date   HGBA1C 6.1 (H) 01/22/2017       Assessment & Plan:   Problem List Items Addressed This Visit       Digestive   Fatty liver   Relevant Orders   Comprehensive metabolic panel     Other   Lactose intolerance   Elevated blood-pressure reading, without diagnosis of hypertension   Other Visit Diagnoses     Dysuria    -  Primary   Relevant Orders   POCT URINALYSIS DIP (CLINITEK) (Completed)   Urinary frequency       Relevant Orders   POCT URINALYSIS DIP (CLINITEK) (Completed)      Patient reassured that blood pressure and urinalysis are normal today. CMP ordered to follow up fatty liver. Patient advised he may have early BPH which is common in men, declined medicine to help.   Return in 1 year for a full physical exam and labs.  No orders of the defined types were placed in this encounter.    Jake Shark, PA-C

## 2021-03-09 LAB — COMPREHENSIVE METABOLIC PANEL
ALT: 83 IU/L — ABNORMAL HIGH (ref 0–44)
AST: 45 IU/L — ABNORMAL HIGH (ref 0–40)
Albumin/Globulin Ratio: 2.8 — ABNORMAL HIGH (ref 1.2–2.2)
Albumin: 5.1 g/dL — ABNORMAL HIGH (ref 3.8–4.9)
Alkaline Phosphatase: 64 IU/L (ref 44–121)
BUN/Creatinine Ratio: 13 (ref 9–20)
BUN: 11 mg/dL (ref 6–24)
Bilirubin Total: 1 mg/dL (ref 0.0–1.2)
CO2: 23 mmol/L (ref 20–29)
Calcium: 9.6 mg/dL (ref 8.7–10.2)
Chloride: 100 mmol/L (ref 96–106)
Creatinine, Ser: 0.87 mg/dL (ref 0.76–1.27)
Globulin, Total: 1.8 g/dL (ref 1.5–4.5)
Glucose: 94 mg/dL (ref 70–99)
Potassium: 4.6 mmol/L (ref 3.5–5.2)
Sodium: 137 mmol/L (ref 134–144)
Total Protein: 6.9 g/dL (ref 6.0–8.5)
eGFR: 101 mL/min/{1.73_m2} (ref >59)

## 2021-07-20 ENCOUNTER — Encounter: Payer: Self-pay | Admitting: Internal Medicine

## 2021-09-26 ENCOUNTER — Encounter: Payer: Self-pay | Admitting: Internal Medicine

## 2021-10-30 ENCOUNTER — Encounter: Payer: Self-pay | Admitting: Internal Medicine

## 2021-12-04 ENCOUNTER — Encounter: Payer: Self-pay | Admitting: Internal Medicine

## 2021-12-24 ENCOUNTER — Encounter: Payer: Self-pay | Admitting: Nurse Practitioner

## 2021-12-24 ENCOUNTER — Ambulatory Visit: Payer: No Typology Code available for payment source | Admitting: Nurse Practitioner

## 2021-12-24 VITALS — BP 130/88 | HR 78 | Temp 98.5°F | Wt 181.0 lb

## 2021-12-24 DIAGNOSIS — R030 Elevated blood-pressure reading, without diagnosis of hypertension: Secondary | ICD-10-CM | POA: Diagnosis not present

## 2021-12-24 NOTE — Patient Instructions (Addendum)
We will plan to let you continue your changes to diet and exercise and see if that makes a difference over the next 2 weeks If your blood pressures are staying down with these changes, then I recommend going back to have the blood pressure rechecked. If not, please let me know and we can consider medication options.

## 2021-12-24 NOTE — Progress Notes (Signed)
Orma Render, DNP, AGNP-c [address], [place] [address] [phone]  Subjective:   Shawn Ramirez is a 56 y.o. 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 He is not currently on BP medications, but has been in the past. He was taken off of BP medication when he was able to lower his pressures with diet, exercise, and weight loss. He has been checking his BP at home and it is higher than 120/80's typically. He reports that he had slacked a little on his diet, but has restarted that. He would like to avoid medication, if possible. Has been on lisinopril, losartan, and hctz in the past. He was last treated with losartan and tolerated this well.   BP Readings from Last 3 Encounters:  12/24/21 130/88  03/08/21 130/84  07/16/17 124/86   Wt Readings from Last 3 Encounters:  12/24/21 181 lb (82.1 kg)  03/08/21 180 lb 3.2 oz (81.7 kg)  07/16/17 174 lb 12.8 oz (79.3 kg)     He was last seen for hypertension 10 months ago.  BP at that visit was 130/84. Management since that visit includes diet. Use of agents associated with hypertension: none.   Symptoms: No chest pain No chest pressure  No palpitations No syncope  No dyspnea No orthopnea  No paroxysmal nocturnal dyspnea No lower extremity edema   Pertinent labs Lab Results  Component Value Date   CHOL 172 01/22/2017   HDL 41 01/22/2017   LDLCALC 113 (H) 01/22/2017   TRIG 82 01/22/2017   CHOLHDL 4.2 01/22/2017   Lab Results  Component Value Date   NA 137 03/08/2021   K 4.6 03/08/2021   CREATININE 0.87 03/08/2021   EGFR 101 03/08/2021   GLUCOSE 94 03/08/2021     The ASCVD Risk score (Arnett DK, et al., 2019) failed to calculate for the following reasons:   Cannot find a previous HDL lab   Cannot find a previous total cholesterol lab   PMH, Medications, and Allergies reviewed and updated in chart as  appropriate.   ROS negative except for what is listed in HPI. Objective:  BP 130/88   Pulse 78   Temp 98.5 F (36.9 C)   Wt 181 lb (82.1 kg)   BMI 27.52 kg/m  Physical Exam Vitals and nursing note reviewed.  Constitutional:      Appearance: Normal appearance.  HENT:     Head: Normocephalic.  Eyes:     Pupils: Pupils are equal, round, and reactive to light.  Neck:     Vascular: No carotid bruit.  Cardiovascular:     Rate and Rhythm: Normal rate and regular rhythm.     Pulses: Normal pulses.     Heart sounds: Normal heart sounds.  Pulmonary:     Effort: Pulmonary effort is normal.     Breath sounds: Normal breath sounds.  Abdominal:     General: Bowel sounds are normal.     Palpations: Abdomen is soft.  Musculoskeletal:     Right lower leg: No edema.     Left lower leg: No edema.  Skin:    General: Skin is warm and dry.     Capillary Refill: Capillary refill takes less than 2 seconds.  Neurological:     General: No focal deficit present.     Mental Status: He is alert.  Psychiatric:        Mood and  Affect: Mood normal.        Behavior: Behavior normal.        Thought Content: Thought content normal.        Judgment: Judgment normal.     Assessment & Plan:   Problem List Items Addressed This Visit     Elevated blood-pressure reading, without diagnosis of hypertension - Primary    Blood pressure 130/88 in the office today. Discussion with patient today about options that may be helpful. Patient expresses that he would like to continue to work on dietary changes, as these have been effective in the past.  I feel this is a reasonable request given that his blood pressure readings are fairly close to normal at this time.  Recommend continued monitoring blood pressures at home.  Goal blood pressure less than 130/80.  If blood pressures remaining outside of goal after approximately 2 weeks recommend contact the office for start of medication.  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         Orma Render, DNP, AGNP-c 12/24/2021  6:38 PM    History, Medications, Surgery, SDOH, and Family History reviewed and updated as appropriate.

## 2021-12-24 NOTE — Assessment & Plan Note (Addendum)
Blood pressure 130/88 in the office today. Discussion with patient today about options that may be helpful. Patient expresses that he would like to continue to work on dietary changes, as these have been effective in the past.  I feel this is a reasonable request given that his blood pressure readings are fairly close to normal at this time.  Recommend continued monitoring blood pressures at home.  Goal blood pressure less than 130/80.  If blood pressures remaining outside of goal after approximately 2 weeks recommend contact the office for start of medication.  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

## 2021-12-31 ENCOUNTER — Telehealth: Payer: Self-pay | Admitting: Nurse Practitioner

## 2021-12-31 NOTE — Telephone Encounter (Signed)
Pt called, he decided he wants to start blood pressure medication, please send in rx  [facility] [employer]

## 2022-01-01 MED ORDER — LOSARTAN POTASSIUM 25 MG PO TABS: 25.0000 mg | ORAL_TABLET | Freq: Every day | ORAL | 1 refills | Status: DC

## 2022-01-01 NOTE — Telephone Encounter (Signed)
Left message for pt to call and schedule 3 mo follow appt and labs with [facility] Dec

## 2022-01-30 ENCOUNTER — Encounter: Payer: Self-pay | Admitting: Internal Medicine

## 2022-03-12 ENCOUNTER — Encounter: Payer: No Typology Code available for payment source | Admitting: Physician Assistant

## 2022-03-25 ENCOUNTER — Ambulatory Visit: Payer: PRIVATE HEALTH INSURANCE | Admitting: Nurse Practitioner

## 2022-03-25 ENCOUNTER — Encounter: Payer: Self-pay | Admitting: Nurse Practitioner

## 2022-03-25 VITALS — BP 132/82 | HR 78 | Ht 69.0 in | Wt 179.6 lb

## 2022-03-25 DIAGNOSIS — H9313 Tinnitus, bilateral: Secondary | ICD-10-CM

## 2022-03-25 DIAGNOSIS — K760 Fatty (change of) liver, not elsewhere classified: Secondary | ICD-10-CM | POA: Diagnosis not present

## 2022-03-25 DIAGNOSIS — Z113 Encounter for screening for infections with a predominantly sexual mode of transmission: Secondary | ICD-10-CM

## 2022-03-25 DIAGNOSIS — E782 Mixed hyperlipidemia: Secondary | ICD-10-CM | POA: Diagnosis not present

## 2022-03-25 DIAGNOSIS — Z0000 Encounter for general adult medical examination without abnormal findings: Secondary | ICD-10-CM | POA: Insufficient documentation

## 2022-03-25 DIAGNOSIS — L700 Acne vulgaris: Secondary | ICD-10-CM

## 2022-03-25 DIAGNOSIS — R3914 Feeling of incomplete bladder emptying: Secondary | ICD-10-CM

## 2022-03-25 DIAGNOSIS — R1010 Upper abdominal pain, unspecified: Secondary | ICD-10-CM

## 2022-03-25 DIAGNOSIS — R7303 Prediabetes: Secondary | ICD-10-CM | POA: Diagnosis not present

## 2022-03-25 DIAGNOSIS — R030 Elevated blood-pressure reading, without diagnosis of hypertension: Secondary | ICD-10-CM

## 2022-03-25 DIAGNOSIS — E559 Vitamin D deficiency, unspecified: Secondary | ICD-10-CM

## 2022-03-25 HISTORY — DX: Upper abdominal pain, unspecified: R10.10

## 2022-03-25 LAB — CBC WITH DIFFERENTIAL/PLATELET
Eos: 3 %
Hematocrit: 46.4 % (ref 37.5–51.0)
Hemoglobin: 15.4 g/dL (ref 13.0–17.7)
Lymphocytes Absolute: 2 10*3/uL (ref 0.7–3.1)
MCV: 90 fL (ref 79–97)
Monocytes Absolute: 0.3 10*3/uL (ref 0.1–0.9)
Monocytes: 6 %
Neutrophils: 51 %
Platelets: 251 10*3/uL (ref 150–450)
RBC: 5.18 x10E6/uL (ref 4.14–5.80)
WBC: 4.9 10*3/uL (ref 3.4–10.8)

## 2022-03-25 LAB — HEMOGLOBIN A1C: Est. average glucose Bld gHb Est-mCnc: 128 mg/dL

## 2022-03-25 LAB — COMPREHENSIVE METABOLIC PANEL
ALT: 83 IU/L — ABNORMAL HIGH (ref 0–44)
AST: 42 IU/L — ABNORMAL HIGH (ref 0–40)
Albumin: 5 g/dL — ABNORMAL HIGH (ref 3.8–4.9)
Alkaline Phosphatase: 79 IU/L (ref 44–121)
BUN/Creatinine Ratio: 17 (ref 9–20)
BUN: 16 mg/dL (ref 6–24)
Creatinine, Ser: 0.95 mg/dL (ref 0.76–1.27)
Globulin, Total: 2.2 g/dL (ref 1.5–4.5)
Glucose: 101 mg/dL — ABNORMAL HIGH (ref 70–99)

## 2022-03-25 NOTE — Assessment & Plan Note (Signed)
Historic elevation in LDL levels. He has made made substantial changes to his diet, which I would expect show improvement of levels.  Plan: - Monitor lipid levels.  - Continue with daily walking - Ensure your diet is high in fiber and low fat proteins.

## 2022-03-25 NOTE — Assessment & Plan Note (Signed)
Historic finding. He has made changes to his diet in the setting of fatty liver disease.  Plan: - Monitor labs today - Continue with diet and exercise regimen.

## 2022-03-25 NOTE — Assessment & Plan Note (Signed)
Patient has discontinued Losartan and reports blood pressure readings around 130/80 with at home readings with no symptoms. Given that his readings are close to goal and his strong desire not take medication, we will monitor at this time. If readings remain higher than 135/85 consistently we will need to consider restart.  - Plan:   - Advise patient to continue monitoring blood pressure at home.   - If blood pressure increases, consider re-evaluating the need for antihypertensive medication.

## 2022-03-25 NOTE — Assessment & Plan Note (Signed)
Patient reports tinnitus, more noticeable in quiet environments, uses music to mask the sound. - Plan:   - Continue to monitor. Consider further evaluation with audiology or ENT if symptoms worsen or change.

## 2022-03-25 NOTE — Progress Notes (Signed)
BP 132/82   Pulse 78   Ht '5\' 9"'$  (1.753 m)   Wt 179 lb 9.6 oz (81.5 kg)   BMI 26.52 kg/m    Subjective:    Patient ID: Shawn Ramirez, male    DOB: 01-13-1966, 57 y.o.   MRN: [id number]  HPI: Shawn Ramirez is a 57 y.o. male presenting on 03/25/2022 for comprehensive medical examination.    Current medical concerns include:  Shawn Ramirez, presents today for a routine physical examination and has concerns about his liver status. He has a history of fatty liver disease diagnosed a few years prior, with initial symptoms including abdominal pain. Shawn Ramirez has made lifestyle adjustments to manage his condition, notably reducing his intake of alcohol and sugar. He reports that alcohol consumption leads to pain, prompting him to abstain completely.  Shawn Ramirez medical history is significant for constipation an RUQ pain, which has led him to decrease his meal frequency to one or two meals daily to mitigate discomfort. He also suffers from skin issues characterized by aggressive boils that result in dark scarring on his arms, back, chest, and other areas. He is concerned this is related to his liver fucntion. Shawn Ramirez has a past smoking habit, which he quit approximately 10-15 years ago.  Additionally, Shawn Ramirez experiences tinnitus, particularly noticeable in quiet settings, but does not report any hearing or vision impairments. He mentions the presence of occasional phlegm in his throat but denies experiencing any reflux symptoms. Changes in bladder function have been noted, with difficulty in bladder emptying occurring over the past one to two years. Shawn Ramirez has discontinued the use of lisinopril for blood pressure control, preferring to avoid medication. He monitors his blood pressure at home, with recent readings similar to the current measurement of 132/82 mmHg.   IMMUNIZATIONS:   Flu: Flu vaccine completed elsewhere this season Prevnar 13: Prevnar 13 N/A for this patient Pneumovax 23: Pneumovax 23 N/A for this patient Prevnar  20: Prevnar 20 N/A for this patient HPV: HPV N/A for this patient Shingrix: Reported as completed Covid-19 vaccine status: Declined, Education has been provided regarding the importance of this vaccine but patient still declined. Advised may receive this vaccine at local pharmacy or Health Dept.or vaccine clinic. Aware to provide a copy of the vaccination record if obtained from local pharmacy or Health Dept. Verbalized acceptance and understanding.  HEALTH MAINTENANCE: Colon Cancer Screening HM Status: is up to date STI Testing HM Status: was completed today PSA Screen HM Status: was completed today Lung Ca Screen HM Status: is not applicable for this patient AAA Screen HM Status: is not applicable for this patient  He reports regular vision exams q1-5y: Yes  He reports regular dental exams q 26m  No  The patient eats a regular, healthy diet. He endorses exercise and/or activity of: Moderate 30 min 3-4x/wk Mode: walking  Pertinent items are noted in HPI.  Most Recent Depression Screen:     03/25/2022    8:50 AM 06/14/2020   10:36 AM 11/27/2016   11:14 AM  Depression screen PHQ 2/9  Decreased Interest 0 0 0  Down, Depressed, Hopeless 0 0 0  PHQ - 2 Score 0 0 0   Most Recent Anxiety Screen:     No data to display         Most Recent Falls Screen:    03/25/2022    8:50 AM 06/14/2020   10:36 AM 11/27/2016   11:14 AM  Fall Risk   Falls in the past year? 0  0 No  Number falls in past yr: 0 0   Injury with Fall? 0 0   Risk for fall due to : No Fall Risks No Fall Risks   Follow up Falls evaluation completed Falls evaluation completed     Past medical history, surgical history, medications, allergies, family history and social history reviewed with patient today and changes made to appropriate areas of the chart.  Past Medical History:  Past Medical History:  Diagnosis Date   Abnormal echocardiogram 11/28/2016   Per records from [place]. Mild AR/TR, EF 60%.   Aortic insufficiency  11/28/2016   Elevated liver enzymes 11/28/2016   Per records 10/2015. AST 43, ALT 76   History of cystic acne 11/28/2016   HTN (hypertension)    IBS (irritable bowel syndrome) 11/28/2016   Mixed hyperlipidemia 11/28/2016   Neuromuscular disorder ([place])    pinched nerve in back    Prediabetes 11/28/2016   10/2015 Hb A1c 5.9% per records   Medications:  Current Outpatient Medications on File Prior to Visit  Medication Sig   losartan (COZAAR) 25 MG tablet Take 1 tablet (25 mg total) by mouth daily. (Patient not taking: Reported on 03/25/2022)   No current facility-administered medications on file prior to visit.   Surgical History:  Past Surgical History:  Procedure Laterality Date   COLONOSCOPY  2008   normal per pt in [place]     no sedation    Allergies:  Allergies  Allergen Reactions   Amoxicillin Rash   Penicillins Rash   Phenobarbital Rash   Social History:  Social History   Socioeconomic History   Marital status: Married    Spouse name: Not on file   Number of children: Not on file   Years of education: Not on file   Highest education level: Not on file  Occupational History   Not on file  Tobacco Use   Smoking status: Former    Packs/day: 1.00    Years: 10.00    Total pack years: 10.00    Types: Cigarettes    Quit date: 11/27/2005    Years since quitting: 16.3   Smokeless tobacco: Never  Substance and Sexual Activity   Alcohol use: No    Comment: stopped in November   Drug use: No   Sexual activity: Yes  Other Topics Concern   Not on file  Social History Narrative   Not on file   Social Determinants of Health   Financial Resource Strain: Not on file  Food Insecurity: Not on file  Transportation Needs: Not on file  Physical Activity: Not on file  Stress: Not on file  Social Connections: Not on file  Intimate Partner Violence: Not on file   Social History   Tobacco Use  Smoking Status Former   Packs/day:  1.00   Years: 10.00   Total pack years: 10.00   Types: Cigarettes   Quit date: 11/27/2005   Years since quitting: 16.3  Smokeless Tobacco Never   Social History   Substance and Sexual Activity  Alcohol Use No   Comment: stopped in November   Family History:  Family History  Problem Relation Age of Onset   Diabetes Mother    Hypertension Mother    Hypertension Father    Osteoporosis Father    Hypertension Sister    Hypertension Brother    Colon cancer Neg Hx    Colon polyps Neg Hx  Rectal cancer Neg Hx    Stomach cancer Neg Hx        Objective:    BP 132/82   Pulse 78   Ht '5\' 9"'$  (1.753 m)   Wt 179 lb 9.6 oz (81.5 kg)   BMI 26.52 kg/m   Wt Readings from Last 3 Encounters:  03/25/22 179 lb 9.6 oz (81.5 kg)  12/24/21 181 lb (82.1 kg)  03/08/21 180 lb 3.2 oz (81.7 kg)    Physical Exam Vitals and nursing note reviewed.  Constitutional:      General: He is not in acute distress.    Appearance: Normal appearance.  HENT:     Head: Normocephalic and atraumatic.     Right Ear: Hearing, tympanic membrane, ear canal and external ear normal.     Left Ear: Hearing, tympanic membrane, ear canal and external ear normal.     Nose: Nose normal.     Right Sinus: No maxillary sinus tenderness or frontal sinus tenderness.     Left Sinus: No maxillary sinus tenderness or frontal sinus tenderness.     Mouth/Throat:     Lips: Pink.     Mouth: Mucous membranes are moist.     Pharynx: Oropharynx is clear.  Eyes:     General: Lids are normal. Vision grossly intact.     Extraocular Movements: Extraocular movements intact.     Conjunctiva/sclera: Conjunctivae normal.     Pupils: Pupils are equal, round, and reactive to light.     Funduscopic exam:    Right eye: No hemorrhage. Red reflex present.        Left eye: No hemorrhage. Red reflex present.    Visual Fields: Right eye visual fields normal and left eye visual fields normal.  Neck:     Thyroid: No thyromegaly.      Vascular: No carotid bruit or JVD.  Cardiovascular:     Rate and Rhythm: Normal rate and regular rhythm.     Chest Wall: PMI is not displaced.     Pulses: Normal pulses.          Dorsalis pedis pulses are 2+ on the right side and 2+ on the left side.       Posterior tibial pulses are 2+ on the right side and 2+ on the left side.     Heart sounds: Normal heart sounds. No murmur heard. Pulmonary:     Effort: Pulmonary effort is normal. No respiratory distress.     Breath sounds: Normal breath sounds.  Chest:  Breasts:    Breasts are symmetrical.  Abdominal:     General: Bowel sounds are normal. There is no distension or abdominal bruit.     Palpations: Abdomen is soft. There is no hepatomegaly, splenomegaly or mass.     Tenderness: There is no abdominal tenderness. There is no right CVA tenderness, left CVA tenderness, guarding or rebound.  Musculoskeletal:        General: Normal range of motion.     Cervical back: Full passive range of motion without pain and neck supple. No tenderness. No spinous process tenderness or muscular tenderness.     Right lower leg: No edema.     Left lower leg: No edema.  Feet:     Right foot:     Toenail Condition: Right toenails are normal.     Left foot:     Toenail Condition: Left toenails are normal.  Lymphadenopathy:     Cervical: No cervical adenopathy.  Upper Body:     Right upper body: No supraclavicular adenopathy.     Left upper body: No supraclavicular adenopathy.  Skin:    General: Skin is warm and dry.     Capillary Refill: Capillary refill takes less than 2 seconds.     Nails: There is no clubbing.  Neurological:     General: No focal deficit present.     Mental Status: He is alert and oriented to person, place, and time.     Cranial Nerves: No cranial nerve deficit.     Sensory: Sensation is intact. No sensory deficit.     Motor: Motor function is intact. No weakness.     Coordination: Coordination is intact. Coordination  normal.     Gait: Gait is intact. Gait normal.  Psychiatric:        Attention and Perception: Attention normal.        Mood and Affect: Mood normal.        Speech: Speech normal.        Behavior: Behavior normal. Behavior is cooperative.        Thought Content: Thought content normal.        Cognition and Memory: Cognition and memory normal.        Judgment: Judgment normal.     Results for orders placed or performed in visit on 03/08/21  Comprehensive metabolic panel  Result Value Ref Range   Glucose 94 70 - 99 mg/dL   BUN 11 6 - 24 mg/dL   Creatinine, Ser 0.87 0.76 - 1.27 mg/dL   eGFR 101 >59 mL/min/1.73   BUN/Creatinine Ratio 13 9 - 20   Sodium 137 134 - 144 mmol/L   Potassium 4.6 3.5 - 5.2 mmol/L   Chloride 100 96 - 106 mmol/L   CO2 23 20 - 29 mmol/L   Calcium 9.6 8.7 - 10.2 mg/dL   Total Protein 6.9 6.0 - 8.5 g/dL   Albumin 5.1 (H) 3.8 - 4.9 g/dL   Globulin, Total 1.8 1.5 - 4.5 g/dL   Albumin/Globulin Ratio 2.8 (H) 1.2 - 2.2   Bilirubin Total 1.0 0.0 - 1.2 mg/dL   Alkaline Phosphatase 64 44 - 121 IU/L   AST 45 (H) 0 - 40 IU/L   ALT 83 (H) 0 - 44 IU/L  POCT URINALYSIS DIP (CLINITEK)  Result Value Ref Range   Color, UA light yellow (A) yellow   Clarity, UA clear clear   Glucose, UA negative negative mg/dL   Bilirubin, UA negative negative   Ketones, POC UA negative negative mg/dL   Spec Grav, UA 1.010 1.010 - 1.025   Blood, UA negative negative   pH, UA 7.0 5.0 - 8.0   POC PROTEIN,UA negative negative, trace   Urobilinogen, UA 0.2 0.2 or 1.0 E.U./dL   Nitrite, UA Negative Negative   Leukocytes, UA Negative Negative      Assessment & Plan:   Problem List Items Addressed This Visit     Mixed hyperlipidemia    Historic elevation in LDL levels. He has made made substantial changes to his diet, which I would expect show improvement of levels.  Plan: - Monitor lipid levels.  - Continue with daily walking - Ensure your diet is high in fiber and low fat proteins.        Relevant Orders   CBC with Differential/Platelet   Comprehensive metabolic panel   Hemoglobin A1c   Lipid panel   TSH   VITAMIN D 25 Hydroxy (Vit-D Deficiency, Fractures)  Prediabetes    Historic finding. He has made changes to his diet in the setting of fatty liver disease.  Plan: - Monitor labs today - Continue with diet and exercise regimen.      Relevant Orders   CBC with Differential/Platelet   Comprehensive metabolic panel   Hemoglobin A1c   Lipid panel   TSH   VITAMIN D 25 Hydroxy (Vit-D Deficiency, Fractures)   Fatty liver    Patient with a history of fatty liver disease. Currently abstaining from alcohol due to associated pain with intake. Reports dietary changes and avoidance of sugar. No current liver pain on examination. No evidence of jaundice present on examination.  Plan:   - Order liver function tests (LFTs) to assess liver enzyme levels.   - Consider liver ultrasound if LFTs are elevated.      Relevant Orders   CBC with Differential/Platelet   Comprehensive metabolic panel   Hemoglobin A1c   Lipid panel   TSH   VITAMIN D 25 Hydroxy (Vit-D Deficiency, Fractures)   Elevated blood-pressure reading, without diagnosis of hypertension    Patient has discontinued Losartan and reports blood pressure readings around 130/80 with at home readings with no symptoms. Given that his readings are close to goal and his strong desire not take medication, we will monitor at this time. If readings remain higher than 135/85 consistently we will need to consider restart.  - Plan:   - Advise patient to continue monitoring blood pressure at home.   - If blood pressure increases, consider re-evaluating the need for antihypertensive medication.      Relevant Orders   CBC with Differential/Platelet   Comprehensive metabolic panel   Hemoglobin A1c   Lipid panel   TSH   VITAMIN D 25 Hydroxy (Vit-D Deficiency, Fractures)   Cystic acne     Patient describes recurrent  boils with scarring on arms, back, chest, and other areas, previously diagnosed as aggressive acne. No current abscesses or lesions present on examination. He is concerned this is related to fatty liver disease.  Plan:   - Will continue to monitor.    - Consider course of doxycycline for management if recur.       Encounter for annual physical exam - Primary    CPE today with no abnormalities noted on exam.  Labs pending. Will make changes as necessary based on results.  Review of HM activities and recommendations discussed and provided on AVS Anticipatory guidance, diet, and exercise recommendations provided.  Medications, allergies, and hx reviewed and updated as necessary.  Plan to f/u with CPE in 1 year or sooner for acute/chronic health needs as directed.        Tinnitus of both ears    Patient reports tinnitus, more noticeable in quiet environments, uses music to mask the sound. - Plan:   - Continue to monitor. Consider further evaluation with audiology or ENT if symptoms worsen or change.       Feeling of incomplete bladder emptying    Patient reports difficulty emptying bladder for the past year or two. Possible component of BPH present. No infection symptoms present.  - Plan:   - Order prostate-specific antigen (PSA) test to evaluate prostate health.      Pain of upper abdomen    He reports upper abdominal pain associated with meal size and alcohol intake. These symptoms appear to have improved with dietary changes. There is concern for possible gastritis or peptic ulcer given the symptoms and concern with increased  mucous in his throat. No epigastric or RUQ tenderness present on exam.  Plan: - Discussed the option of 30 ay PPI treatment to see if this helps to improve symptoms. He would like to wait until the liver levels have come back to decide on this.  - Will make changes as appropriate based on findings.       Other Visit Diagnoses     Health care maintenance        Relevant Orders   CBC with Differential/Platelet   Comprehensive metabolic panel   Hemoglobin A1c   Lipid panel   TSH   VITAMIN D 25 Hydroxy (Vit-D Deficiency, Fractures)   Routine screening for STI (sexually transmitted infection)       Relevant Orders   CBC with Differential/Platelet   Comprehensive metabolic panel   Hemoglobin A1c   Lipid panel   TSH   VITAMIN D 25 Hydroxy (Vit-D Deficiency, Fractures)   Ct, Ng, Mycoplasmas NAA, Urine   HSV(herpes simplex vrs) 1+2 ab-IgG   Hepatitis B surface antigen   RPR   HIV Antibody (routine testing w rflx)   Hepatitis C antibody   HSV 1 and 2 Ab, IgG        Follow up plan: NEXT PREVENTATIVE PHYSICAL DUE IN 1 YEAR. Return in about 6 months (around 09/25/2022) for Fatty Liver and BP.  LABORATORY TESTING:  Health maintenance labs ordered today, if applicable.  - STI testing: done today   PATIENT COUNSELING:   For all adult patients, I recommend A well balanced diet low in saturated fats, cholesterol, and moderation in carbohydrates.   This can be as simple as monitoring portion sizes and cutting back on sugary beverages such as soda and juice to start with.    Daily water consumption of at least 64 ounces.  Physical activity at least 180 minutes per week, if just starting out.   This can be as simple as taking the stairs instead of the elevator and walking 2-3 laps around the office  purposefully every day.   STD protection, partner selection, and regular testing if high risk.  Limited consumption of alcoholic beverages if alcohol is consumed.  For women, I recommend no more than 7 alcoholic beverages per week, spread out throughout the week.  Avoid "binge" drinking or consuming large quantities of alcohol in one setting.   Please let me know if you feel you may need help with reduction or quitting alcohol consumption.   Avoidance of nicotine, if used.  Please let me know if you feel you may need help with reduction or  quitting nicotine use.   Daily mental health attention.  This can be in the form of 5 minute daily meditation, prayer, journaling, yoga, reflection, etc.   Purposeful attention to your emotions and mental state can significantly improve your overall wellbeing and Health.  Please know that I am here to help you with all of your health care goals and am happy to work with you to find a solution that works best for you.  The greatest advice I have received with any changes in life are to take it one step at a time, that even means if all you can focus on is the next 60 seconds, then do that and celebrate your victories.  With any changes in life, you will have set backs, and that is OK. The important thing to remember is, if you have a set back, it is not a failure, it is an opportunity to  try again!  Health Maintenance Recommendations Screening Testing Mammogram Every 1 -2 years based on history and risk factors Starting at age 61 Pap Smear Ages 21-39 every 3 years Ages 49-65 every 5 years with HPV testing More frequent testing may be required based on results and history Colon Cancer Screening Every 1-10 years based on test performed, risk factors, and history Starting at age 59 Bone Density Screening Every 2-10 years based on history Starting at age 12 for women Recommendations for men differ based on medication usage, history, and risk factors AAA Screening One time ultrasound Men 106-57 years old who have every smoked Lung Cancer Screening Low Dose Lung CT every 12 months Age 58-80 years with a 30 pack-year smoking history who still smoke or who have quit within the last 15 years  Screening Labs Routine  Labs: Complete Blood Count (CBC), Complete Metabolic Panel (CMP), Cholesterol (Lipid Panel) Every 6-12 months based on history and medications May be recommended more frequently based on current conditions or previous results Hemoglobin A1c Lab Every 3-12 months based on  history and previous results Starting at age 56 or earlier with diagnosis of diabetes, high cholesterol, BMI >26, and/or risk factors Frequent monitoring for patients with diabetes to ensure blood sugar control Thyroid Panel (TSH w/ T3 & T4) Every 6 months based on history, symptoms, and risk factors May be repeated more often if on medication HIV One time testing for all patients 52 and older May be repeated more frequently for patients with increased risk factors or exposure Hepatitis C One time testing for all patients 95 and older May be repeated more frequently for patients with increased risk factors or exposure Gonorrhea, Chlamydia Every 12 months for all sexually active persons 13-24 years Additional monitoring may be recommended for those who are considered high risk or who have symptoms PSA Men 66-61 years old with risk factors Additional screening may be recommended from age 86-69 based on risk factors, symptoms, and history  Vaccine Recommendations Tetanus Booster All adults every 10 years Flu Vaccine All patients 6 months and older every year COVID Vaccine All patients 12 years and older Initial dosing with booster May recommend additional booster based on age and health history HPV Vaccine 2 doses all patients age 63-26 Dosing may be considered for patients over 26 Shingles Vaccine (Shingrix) 2 doses all adults 70 years and older Pneumonia (Pneumovax 23) All adults 62 years and older May recommend earlier dosing based on health history Pneumonia (Prevnar 88) All adults 68 years and older Dosed 1 year after Pneumovax 23  Additional Screening, Testing, and Vaccinations may be recommended on an individualized basis based on family history, health history, risk factors, and/or exposure.

## 2022-03-25 NOTE — Assessment & Plan Note (Signed)
Patient describes recurrent boils with scarring on arms, back, chest, and other areas, previously diagnosed as aggressive acne. No current abscesses or lesions present on examination. He is concerned this is related to fatty liver disease.  Plan:   - Will continue to monitor.    - Consider course of doxycycline for management if recur.

## 2022-03-25 NOTE — Assessment & Plan Note (Signed)

## 2022-03-25 NOTE — Assessment & Plan Note (Addendum)
He reports upper abdominal pain associated with meal size and alcohol intake. These symptoms appear to have improved with dietary changes. There is concern for possible gastritis or peptic ulcer given the symptoms and concern with increased mucous in his throat. No epigastric or RUQ tenderness present on exam.  Plan: - Discussed the option of 30 ay PPI treatment to see if this helps to improve symptoms. He would like to wait until the liver levels have come back to decide on this.  - Will make changes as appropriate based on findings.

## 2022-03-25 NOTE — Assessment & Plan Note (Signed)
Patient reports difficulty emptying bladder for the past year or two. Possible component of BPH present. No infection symptoms present.  - Plan:   - Order prostate-specific antigen (PSA) test to evaluate prostate health.

## 2022-03-25 NOTE — Assessment & Plan Note (Signed)
Patient with a history of fatty liver disease. Currently abstaining from alcohol due to associated pain with intake. Reports dietary changes and avoidance of sugar. No current liver pain on examination. No evidence of jaundice present on examination.  Plan:   - Order liver function tests (LFTs) to assess liver enzyme levels.   - Consider liver ultrasound if LFTs are elevated.

## 2022-03-25 NOTE — Patient Instructions (Addendum)
I have included information on fatty liver disease and diet for fatty liver disease.   If your liver enzymes look like they are stable, then we can always consider a PPI medication for possible gastritis.   If your blood pressure is looking like it is getting over 135/85 on a routine basis, please let me know.

## 2022-03-26 LAB — CBC WITH DIFFERENTIAL/PLATELET
Basophils Absolute: 0 10*3/uL (ref 0.0–0.2)
Basos: 0 %
EOS (ABSOLUTE): 0.1 10*3/uL (ref 0.0–0.4)
Immature Grans (Abs): 0 10*3/uL (ref 0.0–0.1)
Immature Granulocytes: 0 %
Lymphs: 40 %
MCH: 29.7 pg (ref 26.6–33.0)
MCHC: 33.2 g/dL (ref 31.5–35.7)
Neutrophils Absolute: 2.5 10*3/uL (ref 1.4–7.0)
RDW: 13.2 % (ref 11.6–15.4)

## 2022-03-26 LAB — COMPREHENSIVE METABOLIC PANEL
Albumin/Globulin Ratio: 2.3 — ABNORMAL HIGH (ref 1.2–2.2)
Bilirubin Total: 1 mg/dL (ref 0.0–1.2)
CO2: 23 mmol/L (ref 20–29)
Calcium: 10.4 mg/dL — ABNORMAL HIGH (ref 8.7–10.2)
Chloride: 104 mmol/L (ref 96–106)
Potassium: 5.1 mmol/L (ref 3.5–5.2)
Sodium: 141 mmol/L (ref 134–144)
Total Protein: 7.2 g/dL (ref 6.0–8.5)
eGFR: 93 mL/min/{1.73_m2} (ref >59)

## 2022-03-26 LAB — HEPATITIS C ANTIBODY: Hep C Virus Ab: NONREACTIVE

## 2022-03-26 LAB — LIPID PANEL
Chol/HDL Ratio: 4.4 ratio (ref 0.0–5.0)
Cholesterol, Total: 222 mg/dL — ABNORMAL HIGH (ref 100–199)
HDL: 50 mg/dL (ref >39)
LDL Chol Calc (NIH): 156 mg/dL — ABNORMAL HIGH (ref 0–99)
Triglycerides: 92 mg/dL (ref 0–149)
VLDL Cholesterol Cal: 16 mg/dL (ref 5–40)

## 2022-03-26 LAB — HIV ANTIBODY (ROUTINE TESTING W REFLEX): HIV Screen 4th Generation wRfx: NONREACTIVE

## 2022-03-26 LAB — RPR: RPR Ser Ql: NONREACTIVE

## 2022-03-26 LAB — HSV 1 AND 2 AB, IGG
HSV 1 Glycoprotein G Ab, IgG: 34.2 index — ABNORMAL HIGH (ref 0.00–0.90)
HSV 2 IgG, Type Spec: <0.91 index (ref 0.00–0.90)

## 2022-03-26 LAB — HEMOGLOBIN A1C: Hgb A1c MFr Bld: 6.1 % — ABNORMAL HIGH (ref 4.8–5.6)

## 2022-03-26 LAB — VITAMIN D 25 HYDROXY (VIT D DEFICIENCY, FRACTURES): Vit D, 25-Hydroxy: 24.4 ng/mL — ABNORMAL LOW (ref 30.0–100.0)

## 2022-03-26 LAB — TSH: TSH: 2.4 u[IU]/mL (ref 0.450–4.500)

## 2022-03-26 LAB — HEPATITIS B SURFACE ANTIGEN: Hepatitis B Surface Ag: NEGATIVE

## 2022-03-29 MED ORDER — VITAMIN D3 1.25 MG (50000 UT) PO TABS: 1.0000 | ORAL_TABLET | ORAL | 1 refills | Status: DC

## 2022-03-29 NOTE — Addendum Note (Signed)
Addended by: Eulamae Greenstein, Clarise Cruz E on: 03/29/2022 05:29 PM   Modules accepted: Orders

## 2022-04-03 LAB — CT, NG, MYCOPLASMAS NAA, URINE
Chlamydia trachomatis, NAA: NEGATIVE
Mycoplasma genitalium NAA: NEGATIVE
Mycoplasma hominis NAA: NEGATIVE
Neisseria gonorrhoeae, NAA: NEGATIVE
Ureaplasma spp NAA: NEGATIVE

## 2022-06-25 ENCOUNTER — Other Ambulatory Visit: Payer: Self-pay | Admitting: Nurse Practitioner

## 2022-09-16 ENCOUNTER — Ambulatory Visit: Payer: PRIVATE HEALTH INSURANCE | Admitting: Nurse Practitioner

## 2022-09-16 ENCOUNTER — Encounter: Payer: Self-pay | Admitting: Nurse Practitioner

## 2022-09-16 VITALS — BP 132/84 | HR 69 | Wt 178.6 lb

## 2022-09-16 DIAGNOSIS — R030 Elevated blood-pressure reading, without diagnosis of hypertension: Secondary | ICD-10-CM

## 2022-09-16 DIAGNOSIS — K760 Fatty (change of) liver, not elsewhere classified: Secondary | ICD-10-CM

## 2022-09-16 DIAGNOSIS — R7303 Prediabetes: Secondary | ICD-10-CM

## 2022-09-16 DIAGNOSIS — E559 Vitamin D deficiency, unspecified: Secondary | ICD-10-CM

## 2022-09-16 DIAGNOSIS — E782 Mixed hyperlipidemia: Secondary | ICD-10-CM
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
## 2022-09-16 NOTE — Patient Instructions (Addendum)
We will see where your labs are with the changes you have made.   I am happy that you have had such a positive response to the changes in your diet.   Cholesterol Content in Foods Cholesterol is a waxy, fat-like substance that helps to carry fat in the blood. The body needs cholesterol in small amounts, but too much cholesterol can cause damage to the arteries and heart. What foods have cholesterol?  Cholesterol is found in animal-based foods, such as meat, seafood, and dairy. Generally, low-fat dairy and lean meats have less cholesterol than full-fat dairy and fatty meats. The milligrams of cholesterol per serving (mg per serving) of common cholesterol-containing foods are listed below. Meats and other proteins Egg -- one large whole egg has 186 mg. Veal shank -- 4 oz (113 g) has 141 mg. Lean ground Shawn Ramirez (93% lean) -- 4 oz (113 g) has 118 mg. Fat-trimmed lamb loin -- 4 oz (113 g) has 106 mg. Lean ground beef (90% lean) -- 4 oz (113 g) has 100 mg. Lobster -- 3.5 oz (99 g) has 90 mg. Pork loin chops -- 4 oz (113 g) has 86 mg. Canned salmon -- 3.5 oz (99 g) has 83 mg. Fat-trimmed beef top loin -- 4 oz (113 g) has 78 mg. Frankfurter -- 1 frank (3.5 oz or 99 g) has 77 mg. Crab -- 3.5 oz (99 g) has 71 mg. Roasted chicken without skin, white meat -- 4 oz (113 g) has 66 mg. Light bologna -- 2 oz (57 g) has 45 mg. Deli-cut Shawn Ramirez -- 2 oz (57 g) has 31 mg. Canned tuna -- 3.5 oz (99 g) has 31 mg. Shawn Ramirez -- 1 oz (28 g) has 29 mg. Oysters and mussels (raw) -- 3.5 oz (99 g) has 25 mg. Mackerel -- 1 oz (28 g) has 22 mg. Trout -- 1 oz (28 g) has 20 mg. Pork sausage -- 1 link (1 oz or 28 g) has 17 mg. Salmon -- 1 oz (28 g) has 16 mg. Tilapia -- 1 oz (28 g) has 14 mg. Dairy Soft-serve ice cream --  cup (4 oz or 86 g) has 103 mg. Whole-milk yogurt -- 1 cup (8 oz or 245 g) has 29 mg. Cheddar cheese -- 1 oz (28 g) has 28 mg. American cheese -- 1 oz (28 g) has 28 mg. Whole milk -- 1 cup (8 oz or  250 mL) has 23 mg. 2% milk -- 1 cup (8 oz or 250 mL) has 18 mg. Cream cheese -- 1 tablespoon (Tbsp) (14.5 g) has 15 mg. Cottage cheese --  cup (4 oz or 113 g) has 14 mg. Low-fat (1%) milk -- 1 cup (8 oz or 250 mL) has 10 mg. Sour cream -- 1 Tbsp (12 g) has 8.5 mg. Low-fat yogurt -- 1 cup (8 oz or 245 g) has 8 mg. Nonfat Greek yogurt -- 1 cup (8 oz or 228 g) has 7 mg. Half-and-half cream -- 1 Tbsp (15 mL) has 5 mg. Fats and oils Cod liver oil -- 1 tablespoon (Tbsp) (13.6 g) has 82 mg. Butter -- 1 Tbsp (14 g) has 15 mg. Lard -- 1 Tbsp (12.8 g) has 14 mg. Bacon grease -- 1 Tbsp (12.9 g) has 14 mg. Mayonnaise -- 1 Tbsp (13.8 g) has 5-10 mg. Margarine -- 1 Tbsp (14 g) has 3-10 mg. The items listed above may not be a complete list of foods with cholesterol. Exact amounts of cholesterol in these foods may vary  depending on specific ingredients and brands. Contact a dietitian for more information. What foods do not have cholesterol? Most plant-based foods do not have cholesterol unless you combine them with a food that has cholesterol. Foods without cholesterol include: Grains and cereals. Vegetables. Fruits. Vegetable oils, such as olive, canola, and sunflower oil. Legumes, such as peas, beans, and lentils. Nuts and seeds. Egg whites. The items listed above may not be a complete list of foods that do not have cholesterol. Contact a dietitian for more information. Summary The body needs cholesterol in small amounts, but too much cholesterol can cause damage to the arteries and heart. Cholesterol is found in animal-based foods, such as meat, seafood, and dairy. Generally, low-fat dairy and lean meats have less cholesterol than full-fat dairy and fatty meats. This information is not intended to replace advice given to you by your health care provider. Make sure you discuss any questions you have with your health care provider. Document Revised: 05/19/2020 Document Reviewed: 05/19/2020 Elsevier  Patient Education  2024 [employer].

## 2022-09-17 LAB — COMPREHENSIVE METABOLIC PANEL
ALT: 24 IU/L (ref 0–44)
AST: 25 IU/L (ref 0–40)
Albumin: 4.6 g/dL (ref 3.8–4.9)
Alkaline Phosphatase: 68 IU/L (ref 44–121)
BUN/Creatinine Ratio: 17 (ref 9–20)
BUN: 17 mg/dL (ref 6–24)
Bilirubin Total: 1 mg/dL (ref 0.0–1.2)
CO2: 26 mmol/L (ref 20–29)
Calcium: 10 mg/dL (ref 8.7–10.2)
Chloride: 101 mmol/L (ref 96–106)
Creatinine, Ser: 1 mg/dL (ref 0.76–1.27)
Globulin, Total: 2.5 g/dL (ref 1.5–4.5)
Glucose: 101 mg/dL — ABNORMAL HIGH (ref 70–99)
Potassium: 4.9 mmol/L (ref 3.5–5.2)
Sodium: 139 mmol/L (ref 134–144)
Total Protein: 7.1 g/dL (ref 6.0–8.5)
eGFR: 87 mL/min/{1.73_m2} (ref >59)

## 2022-09-17 LAB — CBC WITH DIFFERENTIAL/PLATELET
Basophils Absolute: 0 10*3/uL (ref 0.0–0.2)
Basos: 1 %
EOS (ABSOLUTE): 0.1 10*3/uL (ref 0.0–0.4)
Eos: 2 %
Hematocrit: 45.7 % (ref 37.5–51.0)
Hemoglobin: 14.9 g/dL (ref 13.0–17.7)
Immature Grans (Abs): 0 10*3/uL (ref 0.0–0.1)
Immature Granulocytes: 0 %
Lymphocytes Absolute: 2.3 10*3/uL (ref 0.7–3.1)
Lymphs: 37 %
MCH: 29.4 pg (ref 26.6–33.0)
MCHC: 32.6 g/dL (ref 31.5–35.7)
MCV: 90 fL (ref 79–97)
Monocytes Absolute: 0.4 10*3/uL (ref 0.1–0.9)
Monocytes: 6 %
Neutrophils Absolute: 3.5 10*3/uL (ref 1.4–7.0)
Neutrophils: 54 %
Platelets: 251 10*3/uL (ref 150–450)
RBC: 5.06 x10E6/uL (ref 4.14–5.80)
RDW: 12.8 % (ref 11.6–15.4)
WBC: 6.4 10*3/uL (ref 3.4–10.8)

## 2022-09-17 LAB — HEMOGLOBIN A1C
Est. average glucose Bld gHb Est-mCnc: 123 mg/dL
Hgb A1c MFr Bld: 5.9 % — ABNORMAL HIGH (ref 4.8–5.6)

## 2022-09-17 LAB — LIPID PANEL
Chol/HDL Ratio: 4.6 ratio (ref 0.0–5.0)
Cholesterol, Total: 196 mg/dL (ref 100–199)
HDL: 43 mg/dL (ref >39)
LDL Chol Calc (NIH): 133 mg/dL — ABNORMAL HIGH (ref 0–99)
Triglycerides: 108 mg/dL (ref 0–149)
VLDL Cholesterol Cal: 20 mg/dL (ref 5–40)

## 2022-09-17 LAB — VITAMIN D 25 HYDROXY (VIT D DEFICIENCY, FRACTURES): Vit D, 25-Hydroxy: 33.9 ng/mL (ref 30.0–100.0)

## 2023-03-03 NOTE — Progress Notes (Signed)
Vaccines: covid/pneumonia/t-dap/ IMMUNIZATIONS:   Flu Vaccine: Flu vaccine declined, patient does not wish to complete Prevnar 13: recommend 20 Prevnar 20: Prevnar 20 declined, patient will complete at a later date Pneumovax 23: recommend 20 Vac Shingrix: Shingrix declined, patient will complete at a later date HPV: N/A or Aged Out Tetanus: Tetanus completed in the last 10 years  COVID: Declined, patient does not wish to have this vaccine RSV: No    BP 132/82   Pulse 67   Ht 5\' 8"  (1.727 m)   Wt 175 lb 9.6 oz (79.7 kg)   SpO2 98%   BMI 26.70 kg/m    Subjective:    Patient ID: Shawn Ramirez, male    DOB: 06-Feb-1965, 58 y.o.   MRN: [id number]  HPI: Shawn Ramirez is a 58 y.o. male presenting on 03/04/2023 for comprehensive medical examination.   History of Present Illness Shawn Ramirez is a 58 year old male with concerns with frequent urination, arthritis and acute knee pain, cough, and elevated blood pressure.  He experiences frequent urination with difficulty initiating urination and incomplete bladder emptying. The process is somewhat painful, and the stream stops before he feels fully emptied. Urine monitored today with no abnormalities noted. He was previously prescribed tamsulosin, although he does not recall what this was for. No history of kidney stones.  He has a history of hypertension but has not been on medication in a while due to improved control. Blood pressure readings consistently around 132/82 in the clinic, but higher readings at home, often in the 150s. He previously took losartan but discontinued it due to a recall of the medication and concerns that this contributed to fatty liver. He has also been on hydrochlorothiazide in the past and tolerated it well. There is a family history of hypertension, with all his siblings on medication, and he is the only one who has managed to stay off medication for a significant time.  He experienced a viral illness about three weeks ago, with  fever lasting three days followed by a persistent cough. The cough produces phlegm and has not resolved. No wheezing, but there is sinus congestion and occasional upset stomach. He has been using nasal spray and Coricidin for symptom relief.  He reports a right knee injury after a dog ran into it, causing hyperextension. There is a history of arthritis in the knee, and the recent injury has led to stiffness and pain, particularly when kneeling. The pain is not severe but noticeable, especially when the knee is fully extended. The injury occurred over a month ago, and the symptoms have remained stable since then. He expresses concern for a torn ligament. He asks about an arthritis clinic locally that he has seen advertised. He is interested in injection options.   He has a history of fatty liver disease and follows a diet similar to that recommended for diabetics, avoiding sugar and high-fat foods. He occasionally experiences liver pain, which he manages with dietary adjustments and apple cider vinegar.   Pertinent items are noted in HPI.  HEALTH MAINTENANCE: Colon Cancer Screening HM Status: is up to date and due 05/23/2027 PSA ScreenHM Status: was ordered today Lung Ca Screen HM Status: N/A AAA Screen (65-75 smoking hx) HM Status: N/A   Most Recent Depression Screen:     03/04/2023    1:38 PM 03/25/2022    8:50 AM 06/14/2020   10:36 AM 11/27/2016   11:14 AM  Depression screen PHQ 2/9  Decreased Interest 0 0 0 0  Down, Depressed, Hopeless 0 0 0 0  PHQ - 2 Score 0 0 0 0   Most Recent Anxiety Screen:     No data to display         Most Recent Falls Screen:    03/04/2023    1:37 PM 03/25/2022    8:50 AM 06/14/2020   10:36 AM 11/27/2016   11:14 AM  Fall Risk   Falls in the past year? 0 0 0 No  Number falls in past yr: 0 0 0   Injury with Fall? 0 0 0   Risk for fall due to : No Fall Risks No Fall Risks No Fall Risks   Follow up Falls evaluation completed Falls evaluation completed  Falls evaluation completed     Past medical history, surgical history, medications, allergies, family history and social history reviewed with patient today and changes made to appropriate areas of the chart.  Past Medical History:  Past Medical History:  Diagnosis Date   Abnormal echocardiogram 11/28/2016   Per records from [place]. Mild AR/TR, EF 60%.   Aortic insufficiency 11/28/2016   Elevated liver enzymes 11/28/2016   Per records 10/2015. AST 43, ALT 76   History of cystic acne 11/28/2016   HTN (hypertension)    IBS (irritable bowel syndrome) 11/28/2016   Mixed hyperlipidemia 11/28/2016   Neuromuscular disorder (HCC)    pinched nerve in back    Prediabetes 11/28/2016   10/2015 Hb A1c 5.9% per records   Medications:  No current outpatient medications on file prior to visit.   No current facility-administered medications on file prior to visit.   Surgical History:  Past Surgical History:  Procedure Laterality Date   COLONOSCOPY  2008   normal per pt in [place]    HERNIA REPAIR     WISDOM TOOTH EXTRACTION     no sedation    Allergies:  Allergies  Allergen Reactions   Amoxicillin Rash   Penicillins Rash   Phenobarbital Rash   Social History:  Social History   Socioeconomic History   Marital status: Married    Spouse name: Not on file   Number of children: Not on file   Years of education: Not on file   Highest education level: Associate degree: occupational, Scientist, product/process development, or vocational program  Occupational History   Not on file  Tobacco Use   Smoking status: Former    Current packs/day: 0.00    Average packs/day: 1 pack/day for 10.0 years (10.0 ttl pk-yrs)    Types: Cigarettes    Start date: 11/28/1995    Quit date: 11/27/2005    Years since quitting: 17.2   Smokeless tobacco: Never  Substance and Sexual Activity   Alcohol use: No    Comment: stopped in November   Drug use: No   Sexual activity: Yes  Other Topics Concern   Not on file  Social History Narrative   Not  on file   Social Drivers of Health   Financial Resource Strain: Patient Declined (03/03/2023)   Overall Financial Resource Strain (CARDIA)    Difficulty of Paying Living Expenses: Patient declined  Food Insecurity: No Food Insecurity (03/03/2023)   Hunger Vital Sign    Worried About Running Out of Food in the Last Year: Never true    Ran Out of Food in the Last Year: Never true  Transportation Needs: No Transportation Needs (03/03/2023)   PRAPARE - Administrator, Civil Service (Medical): No    Lack of Transportation (Non-Medical):  No  Physical Activity: Sufficiently Active (03/03/2023)   Exercise Vital Sign    Days of Exercise per Week: 7 days    Minutes of Exercise per Session: 120 min  Stress: No Stress Concern Present (03/03/2023)   [employer] of Occupational Health - Occupational Stress Questionnaire    Feeling of Stress : Not at all  Social Connections: Unknown (03/03/2023)   Social Connection and Isolation Panel [NHANES]    Frequency of Communication with Friends and Family: Once a week    Frequency of Social Gatherings with Friends and Family: Patient declined    Attends Religious Services: Patient declined    Database administrator or Organizations: No    Attends Engineer, structural: Not on file    Marital Status: Married  Catering manager Violence: Not on file   Social History   Tobacco Use  Smoking Status Former   Current packs/day: 0.00   Average packs/day: 1 pack/day for 10.0 years (10.0 ttl pk-yrs)   Types: Cigarettes   Start date: 11/28/1995   Quit date: 11/27/2005   Years since quitting: 17.2  Smokeless Tobacco Never   Social History   Substance and Sexual Activity  Alcohol Use No   Comment: stopped in November   Family History:  Family History  Problem Relation Age of Onset   Diabetes Mother    Hypertension Mother    Hypertension Father    Osteoporosis Father    Hypertension Sister    Hypertension Brother    Colon cancer  Neg Hx    Colon polyps Neg Hx    Rectal cancer Neg Hx    Stomach cancer Neg Hx        Objective:    BP 132/82   Pulse 67   Ht 5\' 8"  (1.727 m)   Wt 175 lb 9.6 oz (79.7 kg)   SpO2 98%   BMI 26.70 kg/m   Wt Readings from Last 3 Encounters:  03/04/23 175 lb 9.6 oz (79.7 kg)  09/16/22 178 lb 9.6 oz (81 kg)  03/25/22 179 lb 9.6 oz (81.5 kg)    Physical Exam Vitals and nursing note reviewed.  Constitutional:      General: He is not in acute distress.    Appearance: Normal appearance.  HENT:     Head: Normocephalic.     Right Ear: Tympanic membrane normal.     Left Ear: Tympanic membrane normal.     Mouth/Throat:     Mouth: Mucous membranes are moist.     Pharynx: Oropharynx is clear. No oropharyngeal exudate or posterior oropharyngeal erythema.  Eyes:     Conjunctiva/sclera: Conjunctivae normal.  Neck:     Vascular: No carotid bruit.  Cardiovascular:     Rate and Rhythm: Normal rate and regular rhythm.     Pulses: Normal pulses.     Heart sounds: Normal heart sounds.  Pulmonary:     Effort: Pulmonary effort is normal.     Breath sounds: No wheezing or rhonchi.  Abdominal:     General: Bowel sounds are normal. There is no distension.     Palpations: Abdomen is soft. There is no mass.     Tenderness: There is no abdominal tenderness. There is no right CVA tenderness, left CVA tenderness, guarding or rebound.  Musculoskeletal:        General: Tenderness present. Normal range of motion.     Cervical back: Normal range of motion and neck supple.     Right knee: Tenderness present  over the patellar tendon. Normal alignment.     Left knee: Normal.     Comments: Pain in the behind patella reported when kneeling on the knee and when extending the knee with pressure.   Lymphadenopathy:     Cervical: No cervical adenopathy.  Skin:    General: Skin is warm and dry.     Capillary Refill: Capillary refill takes less than 2 seconds.  Neurological:     Mental Status: He is alert  and oriented to person, place, and time.     Motor: No weakness.     Gait: Gait normal.  Psychiatric:        Mood and Affect: Mood normal.        Behavior: Behavior normal.      Results for orders placed or performed in visit on 03/04/23  POCT URINALYSIS DIP (CLINITEK)   Collection Time: 03/04/23  2:56 PM  Result Value Ref Range   Color, UA yellow yellow   Clarity, UA clear clear   Glucose, UA negative negative mg/dL   Bilirubin, UA negative negative   Ketones, POC UA negative negative mg/dL   Spec Grav, UA 4.098 1.191 - 1.025   Blood, UA negative negative   pH, UA 6.0 5.0 - 8.0   POC PROTEIN,UA negative negative, trace   Urobilinogen, UA 0.2 0.2 or 1.0 E.U./dL   Nitrite, UA Negative Negative   Leukocytes, UA Negative Negative      Assessment & Plan:   Problem List Items Addressed This Visit     Elevated liver enzymes   Repeat labs today      Relevant Orders   CMP14+EGFR   Fatty liver   Chronic condition managed with diet and exercise. Will monitor lab values today. No evidence of worsening symptoms.       Relevant Orders   CMP14+EGFR   Lipid panel   Hypertension   Clinic BP readings around 132/82, home readings 140s-150s. Family history of hypertension. Previously on losartan, discontinued due to recall. Discussed starting hydrochlorothiazide for its well tolerated profile and effectiveness in reducing BP. Emphasized importance of BP management to prevent complications such as stroke. - Prescribe hydrochlorothiazide once daily      Relevant Medications   hydrochlorothiazide (HYDRODIURIL) 25 MG tablet   Encounter for annual physical exam - Primary   CPE completed today. Review of HM activities and recommendations discussed and provided on AVS. Anticipatory guidance, diet, and exercise recommendations provided. Medications, allergies, and hx reviewed and updated as necessary. Orders placed as listed below.  Plan: - Labs ordered. Will make changes as necessary  based on results.  - I will review these results and send recommendations via MyChart or a telephone call.  - F/U with CPE in 1 year or sooner for acute/chronic health needs as directed.        Relevant Orders   CBC with Differential/Platelet   CMP14+EGFR   Hemoglobin A1c   Lipid panel   PSA Total (Reflex To Free)   TSH   Right knee pain   Reports chronic arthritic with overriding acute right knee pain following hyperextension injury. Symptoms include stiffness, pain on kneeling, and possible minor ligament tear. No locking or giving out. Pain persists for over a month. He is interested in an arthritis clinic in town. I do not know anything about this particular clinic, but it would be acceptable for him to speak with them about treatments. Discussed potential treatments including steroid injections, PRP, and gel injections.  - Self  refer to arthritis pain center for consultation - If no improvement with this, consider orthopedic referral for evaluation.       Subacute cough   Persistent cough for three weeks [place]-febrile illness. Symptoms include mucus production and raspy breathing. No wheezing or crackles on lung auscultation. Likely [place]-viral bronchitis. Discussed potential for a steroid burst to reduce inflammation. Explained that [place]-viral bronchitis can last weeks to even months and that a steroid burst may help reduce inflammation and shorten symptom duration. Consider chest x-ray if symptoms persist beyond six to eight weeks. - Prescribe prednisone burst (2 tablets in the morning for 5 days) - Order blood work to check for signs of infection or inflammation      Relevant Medications   predniSONE (DELTASONE) 20 MG tablet   Other Relevant Orders   CBC with Differential/Platelet   Frequent urination   Reports frequent urination, difficulty starting urination, incomplete bladder emptying, and dysuria. Symptoms suggestive of BPH. Family history of prostate cancer noted in his  father. Discussed potential use of tamsulosin (Flomax) to relax the prostate and improve urine flow.  PSA is elevated consider referral to urology.  - Order PSA test - Consider tamsulosin (Flomax)       Relevant Orders   PSA Total (Reflex To Free)   POCT URINALYSIS DIP (CLINITEK) (Completed)   Mixed hyperlipidemia   Relevant Medications   hydrochlorothiazide (HYDRODIURIL) 25 MG tablet   Other Relevant Orders   Lipid panel   Prediabetes   Relevant Orders   Hemoglobin A1c   Other Visit Diagnoses       Vitamin D deficiency       Relevant Medications   Vitamin D, Ergocalciferol, (DRISDOL) 1.25 MG (50000 UNIT) CAPS capsule        Follow up plan: NEXT PREVENTATIVE PHYSICAL DUE IN 1 YEAR. Return in about 6 months (around 09/01/2023) for Med Management 30- HTN, Fatty Liver.  LABORATORY TESTING:  Health maintenance labs ordered today, if applicable.    PATIENT COUNSELING:   For all adult patients, I recommend A well balanced diet low in saturated fats, cholesterol, and moderation in carbohydrates.  This can be as simple as monitoring portion sizes and cutting back on sugary beverages such as soda and juice to start with.    Daily water consumption of at least 64 ounces.  Physical activity at least 180 minutes per week, if just starting out.  This can be as simple as taking the stairs instead of the elevator and walking 2-3 laps around the office  purposefully every day.   STD protection, partner selection, and regular testing if high risk.  Limited consumption of alcoholic beverages if alcohol is consumed. For men, I recommend no more than 14 alcoholic beverages per week, spread out throughout the week (max 2 per day). Avoid "binge" drinking or consuming large quantities of alcohol in one setting.  Please let me know if you feel you may need help with reduction or quitting alcohol consumption.   Avoidance of nicotine, if used. Please let me know if you feel you may need help  with reduction or quitting nicotine use.   Daily mental health attention. This can be in the form of 5 minute daily meditation, prayer, journaling, yoga, reflection, etc.  Purposeful attention to your emotions and mental state can significantly improve your overall wellbeing and Health.  Please know that I am here to help you with all of your health care goals and am happy to work with you to find  a solution that works best for you.  The greatest advice I have received with any changes in life are to take it one step at a time, that even means if all you can focus on is the next 60 seconds, then do that and celebrate your victories.  With any changes in life, you will have set backs, and that is OK. The important thing to remember is, if you have a set back, it is not a failure, it is an opportunity to try again!  Health Maintenance Recommendations Screening Testing Mammogram Every 1 -2 years based on history and risk factors Starting at age 66 Pap Smear Ages 21-39 every 3 years Ages 21-65 every 5 years with HPV testing More frequent testing may be required based on results and history Colon Cancer Screening Every 1-10 years based on test performed, risk factors, and history Starting at age 64 Bone Density Screening Every 2-10 years based on history Starting at age 66 for women Recommendations for men differ based on medication usage, history, and risk factors AAA Screening One time ultrasound Men 28-58 years old who have every smoked Lung Cancer Screening Low Dose Lung CT every 12 months Age 15-80 years with a 30 pack-year smoking history who still smoke or who have quit within the last 15 years   Screening Labs Routine  Labs: Complete Blood Count (CBC), Complete Metabolic Panel (CMP), Cholesterol (Lipid Panel) Every 6-12 months based on history and medications May be recommended more frequently based on current conditions or previous results Hemoglobin A1c Lab Every 3-12  months based on history and previous results Starting at age 57 or earlier with diagnosis of diabetes, high cholesterol, BMI >26, and/or risk factors Frequent monitoring for patients with diabetes to ensure blood sugar control Thyroid Panel (TSH) Every 6 months based on history, symptoms, and risk factors May be repeated more often if on medication HIV One time testing for all patients 4 and older May be repeated more frequently for patients with increased risk factors or exposure Hepatitis C One time testing for all patients 59 and older May be repeated more frequently for patients with increased risk factors or exposure Gonorrhea, Chlamydia Every 12 months for all sexually active persons 13-24 years Additional monitoring may be recommended for those who are considered high risk or who have symptoms Every 12 months for any woman on birth control, regardless of sexual activity PSA Men 67-49 years old with risk factors Additional screening may be recommended from age 24-69 based on risk factors, symptoms, and history  Vaccine Recommendations Tetanus Booster All adults every 10 years Flu Vaccine All patients 6 months and older every year COVID Vaccine All patients 12 years and older Initial dosing with booster May recommend additional booster based on age and health history HPV Vaccine 2 doses all patients age 38-26 Dosing may be considered for patients over 26 Shingles Vaccine (Shingrix) 2 doses all adults 55 years and older Pneumonia (Pneumovax 47) All adults 65 years and older May recommend earlier dosing based on health history One year apart from Prevnar 54 Pneumonia (Prevnar 58) All adults 65 years and older Dosed 1 year after Pneumovax 23 Pneumonia (Prevnar 20) One time alternative to the two dosing of 13 and 23 For all adults with initial dose of 23, 20 is recommended 1 year later For all adults with initial dose of 13, 23 is still recommended as second option 1 year  later  Additional Screening, Testing, and Vaccinations may be recommended on an  individualized basis based on family history, health history, risk factors, and/or exposure.

## 2023-03-04 ENCOUNTER — Ambulatory Visit: Payer: Medicaid Other | Admitting: Nurse Practitioner

## 2023-03-04 ENCOUNTER — Encounter: Payer: Self-pay | Admitting: Nurse Practitioner

## 2023-03-04 VITALS — BP 132/82 | HR 67 | Ht 68.0 in | Wt 175.6 lb

## 2023-03-04 DIAGNOSIS — Z0000 Encounter for general adult medical examination without abnormal findings: Secondary | ICD-10-CM

## 2023-03-04 DIAGNOSIS — R7303 Prediabetes: Secondary | ICD-10-CM | POA: Diagnosis not present

## 2023-03-04 DIAGNOSIS — M25561 Pain in right knee: Secondary | ICD-10-CM

## 2023-03-04 DIAGNOSIS — E559 Vitamin D deficiency, unspecified: Secondary | ICD-10-CM | POA: Diagnosis not present

## 2023-03-04 DIAGNOSIS — R052 Subacute cough: Secondary | ICD-10-CM | POA: Diagnosis not present

## 2023-03-04 DIAGNOSIS — R350 Frequency of micturition: Secondary | ICD-10-CM

## 2023-03-04 DIAGNOSIS — K760 Fatty (change of) liver, not elsewhere classified: Secondary | ICD-10-CM | POA: Diagnosis not present

## 2023-03-04 DIAGNOSIS — R748 Abnormal levels of other serum enzymes: Secondary | ICD-10-CM | POA: Diagnosis not present

## 2023-03-04 DIAGNOSIS — I10 Essential (primary) hypertension: Secondary | ICD-10-CM

## 2023-03-04 DIAGNOSIS — E782 Mixed hyperlipidemia: Secondary | ICD-10-CM

## 2023-03-04 DIAGNOSIS — R351 Nocturia: Secondary | ICD-10-CM | POA: Insufficient documentation

## 2023-03-04 HISTORY — DX: Subacute cough: R05.2

## 2023-03-04 LAB — POCT URINALYSIS DIP (CLINITEK)
Bilirubin, UA: NEGATIVE
Blood, UA: NEGATIVE
Glucose, UA: NEGATIVE mg/dL
Ketones, POC UA: NEGATIVE mg/dL
Leukocytes, UA: NEGATIVE
Nitrite, UA: NEGATIVE
POC PROTEIN,UA: NEGATIVE
Spec Grav, UA: 1.01 (ref 1.010–1.025)
Urobilinogen, UA: 0.2 U/dL
pH, UA: 6 (ref 5.0–8.0)

## 2023-03-04 MED ORDER — VITAMIN D (ERGOCALCIFEROL) 1.25 MG (50000 UNIT) PO CAPS: 50000.0000 [IU] | ORAL_CAPSULE | ORAL | 3 refills | Status: AC

## 2023-03-04 MED ORDER — HYDROCHLOROTHIAZIDE 25 MG PO TABS: 25.0000 mg | ORAL_TABLET | Freq: Every day | ORAL | 11 refills | Status: DC

## 2023-03-04 MED ORDER — PREDNISONE 20 MG PO TABS: 40.0000 mg | ORAL_TABLET | Freq: Every day | ORAL | 0 refills | Status: DC

## 2023-03-04 NOTE — Assessment & Plan Note (Signed)
Reports frequent urination, difficulty starting urination, incomplete bladder emptying, and dysuria. Symptoms suggestive of BPH. Family history of prostate cancer noted in his father. Discussed potential use of tamsulosin (Flomax) to relax the prostate and improve urine flow.  PSA is elevated consider referral to urology.  - Order PSA test - Consider tamsulosin (Flomax)

## 2023-03-04 NOTE — Assessment & Plan Note (Signed)
Clinic BP readings around 132/82, home readings 140s-150s. Family history of hypertension. Previously on losartan, discontinued due to recall. Discussed starting hydrochlorothiazide for its well tolerated profile and effectiveness in reducing BP. Emphasized importance of BP management to prevent complications such as stroke. - Prescribe hydrochlorothiazide once daily

## 2023-03-04 NOTE — Assessment & Plan Note (Signed)
Reports chronic arthritic with overriding acute right knee pain following hyperextension injury. Symptoms include stiffness, pain on kneeling, and possible minor ligament tear. No locking or giving out. Pain persists for over a month. He is interested in an arthritis clinic in town. I do not know anything about this particular clinic, but it would be acceptable for him to speak with them about treatments. Discussed potential treatments including steroid injections, PRP, and gel injections.  - Self refer to arthritis pain center for consultation - If no improvement with this, consider orthopedic referral for evaluation.

## 2023-03-04 NOTE — Patient Instructions (Addendum)
Knee Pain If you want to check out the [facility] for the knees that would be fine. The number to the [facility] location is [phone]. If you decide that this is not the right option for you, please let me know and I can send a referral to the orthopedics office for evaluation.   Prostate We will check your prostate levels today to make sure that these numbers are normal. If they are not normal, we can make changes to help manage this.   Cough We can try a steroid burst today to see if this will help the cough. This will reduce inflammation in the lungs.  Mucinex can be helpful to pull any mucus out and help to make the cough productive.  We can consider a chest x-ray if the cough symptoms continue over the next couple of weeks.   Blood Pressure I have sent in the hydrochlorothiazide for your blood pressure. This should help to bring this down and keep it down. If you have any issues with the medication, let me know and we can make changes as needed .   Chronic Knee Pain, Adult Knee pain that lasts longer than 3 months is called chronic knee pain. You may have pain in one or both knees. Symptoms of chronic knee pain may also include swelling and stiffness. Many conditions can cause chronic knee pain. The most common cause is wear and tear of your knee joint as you get older. Other possible causes include: A disease that causes inflammation of the knee, such as rheumatoid arthritis. This usually affects both knees. A condition called inflammatory arthritis, such as gout. An injury to the knee that causes arthritis. An injury to the knee that damages the ligaments. Ligaments are tissues that connect bones to each other. Runner's knee or pain behind the kneecap. Treatment for chronic knee pain depends on the cause. The main treatments for chronic knee pain are: Doing exercises to help your knee move better and get stronger, called physical therapy. Losing weight if you are  overweight. This condition may also be treated with medicines, injections, a knee sleeve or brace, and by using crutches. You health care provider may also recommend rest, ice, pressure (compression), and elevation, also called RICE therapy. Follow these instructions at home: If you have a knee sleeve or brace that can be taken off:  Wear the knee sleeve or brace as told by your provider. Take it off only if your provider says that you can. Check the skin around it every day. Tell your provider if you see problems. Loosen the knee sleeve or brace if your toes tingle, are numb, or turn cold and blue. Keep the knee sleeve or brace clean and dry. Bathing If the knee sleeve or brace is not waterproof: Do not let it get wet. Cover it when you take a bath or shower. Use a cover that does not let any water in. Managing pain, stiffness, and swelling     If told, put heat on the area. Do this as often as told. Use the heat source that your provider recommends, such as a moist heat pack or a heating pad. If you have a knee sleeve or brace that you can take off, remove it as told. Place a towel between your skin and the heat source. Leave the heat on for 20-30 minutes. If told, put ice on the area. If you have a knee sleeve or brace that you can take off, remove it  as told. Put ice in a plastic bag. Place a towel between your skin and the bag. Leave the ice on for 20 minutes, 2-3 times a day. If your skin turns bright red, remove the ice or heat right away to prevent skin damage. The risk of damage is higher if you cannot feel pain, heat, or cold. Move your toes often to reduce stiffness and swelling. Raise the injured area above the level of your heart while you are sitting or lying down. Use a pillow to support your foot as needed. Activity Avoid activities where both feet leave the ground at the same time. Avoid running, jumping rope, and doing jumping jacks. Follow the exercise plan that  your provider made for you. Your provider may suggest that you: Avoid activities that make knee pain worse. This may mean that you need to change your exercise routines, sports, or job duties. Wear shoes with cushioned soles. Avoid sports that require running and sudden changes in direction. Do physical therapy. Physical therapy helps your knee move better and get stronger. Exercise as told. Do exercises that increase balance and strength, such as tai chi and yoga. Do not stand or walk on your injured knee until you're told it's okay. Use crutches as told. Return to normal activities when you're told. Ask what things are safe for you to do. General instructions Take your medicines only as told by your provider. If you are overweight, work with your provider and an expert in healthy eating called a dietitian to set goals to lose weight. Losing even a little weight can reduce knee pain. Being overweight can make your knee hurt more. Do not smoke, vape, or use products with nicotine or tobacco in them. If you need help quitting, talk with your provider. Keep all follow-up visits. Your provider will monitor your pain and try other treatments if needed. Contact a health care provider if: You have knee pain that is not getting better or gets worse. You are not able to do your exercises due to knee pain. Get help right away if: Your knee swells and the swelling becomes worse. You cannot move your knee. You have severe knee pain. This information is not intended to replace advice given to you by your health care provider. Make sure you discuss any questions you have with your health care provider. Document Revised: 10/10/2022 Document Reviewed: 03/04/2022 Elsevier Patient Education  2024 [employer].

## 2023-03-04 NOTE — Assessment & Plan Note (Signed)
Repeat labs today

## 2023-03-04 NOTE — Assessment & Plan Note (Signed)

## 2023-03-04 NOTE — Assessment & Plan Note (Addendum)
>>  ASSESSMENT AND PLAN FOR FATTY LIVER WRITTEN ON 03/04/2023  6:27 PM BY Yaretzi Ernandez E, NP  Chronic condition managed with diet and exercise. Will monitor lab values today. No evidence of worsening symptoms.    >>ASSESSMENT AND PLAN FOR ELEVATED LIVER ENZYMES WRITTEN ON 03/04/2023  6:32 PM BY Zylah Elsbernd E, NP  Repeat labs today

## 2023-03-04 NOTE — Assessment & Plan Note (Signed)
Persistent cough for three weeks Winnie-febrile illness. Symptoms include mucus production and raspy breathing. No wheezing or crackles on lung auscultation. Likely Squier-viral bronchitis. Discussed potential for a steroid burst to reduce inflammation. Explained that Suarez-viral bronchitis can last weeks to even months and that a steroid burst may help reduce inflammation and shorten symptom duration. Consider chest x-ray if symptoms persist beyond six to eight weeks. - Prescribe prednisone burst (2 tablets in the morning for 5 days) - Order blood work to check for signs of infection or inflammation

## 2023-03-05 LAB — CMP14+EGFR
ALT: 23 [IU]/L (ref 0–44)
AST: 21 [IU]/L (ref 0–40)
Albumin: 4.7 g/dL (ref 3.8–4.9)
Alkaline Phosphatase: 68 [IU]/L (ref 44–121)
BUN/Creatinine Ratio: 15 (ref 9–20)
BUN: 13 mg/dL (ref 6–24)
Bilirubin Total: 1 mg/dL (ref 0.0–1.2)
CO2: 22 mmol/L (ref 20–29)
Calcium: 10 mg/dL (ref 8.7–10.2)
Chloride: 99 mmol/L (ref 96–106)
Creatinine, Ser: 0.89 mg/dL (ref 0.76–1.27)
Globulin, Total: 2.5 g/dL (ref 1.5–4.5)
Glucose: 93 mg/dL (ref 70–99)
Potassium: 4.9 mmol/L (ref 3.5–5.2)
Sodium: 141 mmol/L (ref 134–144)
Total Protein: 7.2 g/dL (ref 6.0–8.5)
eGFR: 99 mL/min/{1.73_m2} (ref >59)

## 2023-03-05 LAB — CBC WITH DIFFERENTIAL/PLATELET
Basophils Absolute: 0 10*3/uL (ref 0.0–0.2)
Basos: 0 %
EOS (ABSOLUTE): 0.1 10*3/uL (ref 0.0–0.4)
Eos: 2 %
Hematocrit: 45.4 % (ref 37.5–51.0)
Hemoglobin: 15.2 g/dL (ref 13.0–17.7)
Immature Grans (Abs): 0 10*3/uL (ref 0.0–0.1)
Immature Granulocytes: 0 %
Lymphocytes Absolute: 2.5 10*3/uL (ref 0.7–3.1)
Lymphs: 37 %
MCH: 30 pg (ref 26.6–33.0)
MCHC: 33.5 g/dL (ref 31.5–35.7)
MCV: 90 fL (ref 79–97)
Monocytes Absolute: 0.4 10*3/uL (ref 0.1–0.9)
Monocytes: 6 %
Neutrophils Absolute: 3.8 10*3/uL (ref 1.4–7.0)
Neutrophils: 55 %
Platelets: 337 10*3/uL (ref 150–450)
RBC: 5.06 x10E6/uL (ref 4.14–5.80)
RDW: 12.7 % (ref 11.6–15.4)
WBC: 6.9 10*3/uL (ref 3.4–10.8)

## 2023-03-05 LAB — LIPID PANEL
Chol/HDL Ratio: 4.3 {ratio} (ref 0.0–5.0)
Cholesterol, Total: 199 mg/dL (ref 100–199)
HDL: 46 mg/dL (ref >39)
LDL Chol Calc (NIH): 134 mg/dL — ABNORMAL HIGH (ref 0–99)
Triglycerides: 104 mg/dL (ref 0–149)
VLDL Cholesterol Cal: 19 mg/dL (ref 5–40)

## 2023-03-05 LAB — HEMOGLOBIN A1C
Est. average glucose Bld gHb Est-mCnc: 137 mg/dL
Hgb A1c MFr Bld: 6.4 % — ABNORMAL HIGH (ref 4.8–5.6)

## 2023-03-05 LAB — PSA TOTAL (REFLEX TO FREE): Prostate Specific Ag, Serum: 2.6 ng/mL (ref 0.0–4.0)

## 2023-03-05 LAB — TSH: TSH: 1.99 u[IU]/mL (ref 0.450–4.500)

## 2023-03-13 ENCOUNTER — Encounter: Payer: Self-pay | Admitting: Nurse Practitioner

## 2023-04-14 ENCOUNTER — Encounter: Payer: No Typology Code available for payment source | Admitting: Nurse Practitioner

## 2023-05-29 ENCOUNTER — Other Ambulatory Visit: Payer: Self-pay | Admitting: Nurse Practitioner

## 2023-09-01 ENCOUNTER — Ambulatory Visit: Payer: Medicaid Other | Admitting: Nurse Practitioner

## 2023-09-01 ENCOUNTER — Encounter: Payer: Self-pay | Admitting: Nurse Practitioner

## 2023-09-01 VITALS — BP 128/82 | HR 60 | Wt 177.8 lb

## 2023-09-01 DIAGNOSIS — G8929 Other chronic pain: Secondary | ICD-10-CM | POA: Diagnosis not present

## 2023-09-01 DIAGNOSIS — E782 Mixed hyperlipidemia: Secondary | ICD-10-CM

## 2023-09-01 DIAGNOSIS — K760 Fatty (change of) liver, not elsewhere classified: Secondary | ICD-10-CM | POA: Diagnosis not present

## 2023-09-01 DIAGNOSIS — M25561 Pain in right knee: Secondary | ICD-10-CM | POA: Diagnosis not present

## 2023-09-01 DIAGNOSIS — M25371 Other instability, right ankle: Secondary | ICD-10-CM | POA: Diagnosis not present

## 2023-09-01 DIAGNOSIS — Z6827 Body mass index (BMI) 27.0-27.9, adult: Secondary | ICD-10-CM

## 2023-09-01 DIAGNOSIS — I10 Essential (primary) hypertension: Secondary | ICD-10-CM

## 2023-09-01 DIAGNOSIS — R7303 Prediabetes: Secondary | ICD-10-CM

## 2023-09-01 DIAGNOSIS — R351 Nocturia: Secondary | ICD-10-CM | POA: Diagnosis not present

## 2023-09-01 DIAGNOSIS — E559 Vitamin D deficiency, unspecified: Secondary | ICD-10-CM

## 2023-09-01 DIAGNOSIS — E7800 Pure hypercholesterolemia, unspecified: Secondary | ICD-10-CM | POA: Diagnosis not present

## 2023-09-01 MED ORDER — HYDROCHLOROTHIAZIDE 25 MG PO TABS: 25.0000 mg | ORAL_TABLET | Freq: Every day | ORAL | 3 refills | Status: AC

## 2023-09-01 NOTE — Assessment & Plan Note (Signed)
 Previous LDL cholesterol elevated. Switched from regular cream to unsweetened coconut milk, potentially improving cholesterol levels. - Order lipid panel to assess current cholesterol levels

## 2023-09-01 NOTE — Assessment & Plan Note (Signed)
 Blood pressure well-controlled on current medication regimen of hydrochlorothiazide  25mg  once a day. No headaches, vision changes, or dizziness. Well-hydrated without dehydration symptoms on hydrochlorothiazide . Recommend moving medication to an area that he visits every morning to aid in remembering to take his medications routinely.  - Continue hydrochlorothiazide  - Change prescription to 90-day supply

## 2023-09-01 NOTE — Assessment & Plan Note (Signed)
 Reports nocturia 1-4 times per night without hesitancy or weak stream. PSA levels normal. Discussed potential causes including overactive bladder and benign prostatic hypertrophy. - Consider Flomax  if symptoms persist - Consider referral to urology if symptoms worsen

## 2023-09-01 NOTE — Assessment & Plan Note (Signed)
 Previous A1c 6.4%. He has cut out sugar from his diet and changed his cream to unsweetened coconut milk. Will monitor labs today.

## 2023-09-01 NOTE — Assessment & Plan Note (Signed)
 Continue on once a week vitamin D3 dosing. Will monitor levels today.

## 2023-09-01 NOTE — Assessment & Plan Note (Signed)
 Continue with daily walking and dietary modifications with low fat and low sugar options. Weight is stable.

## 2023-09-01 NOTE — Assessment & Plan Note (Signed)
 Recurrent right ankle sprains due to ligamentous laxity, occurring 2-3 times per year, often when walking dogs. Adapts by allowing himself to fall to prevent further injury. Discussed recommendation of daily strengthening exercises to help prevent instability.  - Consider writing ABC's with each foot at least once daily to help with strengthening and flexibility of the ankles.

## 2023-09-01 NOTE — Assessment & Plan Note (Signed)
 Previous liver enzymes normalized. He is working on a regular diet with low fat and low sugar options and walking daily. Will recheck enzymes today. No tenderness or hepatomegaly noted on examination.

## 2023-09-01 NOTE — Progress Notes (Signed)
 Shawn Doing, DNP, AGNP-c [facility]  [address] [place], [place] [address] [phone]  ESTABLISHED PATIENT- Chronic Health and/or Follow-Up Visit  Blood pressure 128/82, pulse 60, weight 177 lb 12.8 oz (80.6 kg).   History of Present Illness Shawn Ramirez is a 58 year old male with hypertension who presents for a routine follow-up visit.  He is currently taking hydrochlorothiazide  and vitamin D . He does not frequently check his blood pressure at home but noted it was good during today's visit. No symptoms such as headaches, vision changes, or dizziness. He has maintained hydration over the summer. No muscle cramps, decreased urination, or dark urine while on hydrochlorothiazide    He has significantly reduced his sugar intake and does not experience cravings for sugar. He occasionally indulges in sweets but does not find it difficult to stop consuming sugar once he starts. His weight has been stable, typically ranging between 170 and 173 pounds, with a slight increase to 175 pounds today, attributed to recent dietary choices.  He experiences nocturia, getting up to urinate once or twice a night, sometimes more. He does not consume liquids close to bedtime and takes his blood pressure medication in the morning. He has difficulty swallowing pills with hot beverages and prefers cold drinks with meals. PSA earlier this year was normal and he denies stop and go stream, weak stream, or decreased urine output.  He has a history of knee issues, including torn ligaments in his late teens and Shawn Ramirez twenties, leading to a tendency to twist his right ankle easily, occurring two to three times a year. He has learned to fall rather than fight it to prevent further injury. He also has a history of arthritis in his knee, which has not been bothersome lately. He recalls experiencing tightness during a flight to [place]  in the winter, attributed to limited leg space on the plane.  He was  laid off from his job in October and has not been working since. He and his wife have calculated that he can afford to retire Shawn Ramirez, although he is concerned about healthcare costs until he qualifies for Medicare in six years. His wife is already retired, and they are managing on her income.   He typically walks his dogs daily, covering distances ranging from one to four miles, depending on his schedule, and adjusts the timing of walks to avoid the heat.   All ROS negative with exception of what is listed above.   PHYSICAL EXAM Physical Exam Vitals and nursing note reviewed.  Constitutional:      Appearance: Normal appearance.  HENT:     Head: Normocephalic.  Eyes:     Pupils: Pupils are equal, round, and reactive to light.  Cardiovascular:     Rate and Rhythm: Normal rate and regular rhythm.     Pulses: Normal pulses.     Heart sounds: Normal heart sounds.  Pulmonary:     Effort: Pulmonary effort is normal.     Breath sounds: Normal breath sounds.  Abdominal:     General: Abdomen is flat. Bowel sounds are normal. There is no distension.     Palpations: Abdomen is soft. There is no mass.     Tenderness: There is no abdominal tenderness. There is no right CVA tenderness, left CVA tenderness, guarding or rebound.  Musculoskeletal:        General: Normal range of motion.     Cervical back: Normal range of motion.     Right lower leg: No edema.  Left lower leg: No edema.  Skin:    General: Skin is warm and dry.     Capillary Refill: Capillary refill takes less than 2 seconds.  Neurological:     General: No focal deficit present.     Mental Status: He is alert and oriented to person, place, and time.     Motor: No weakness.     Gait: Gait normal.  Psychiatric:        Mood and Affect: Mood normal.      PLAN Problem List Items Addressed This Visit     Mixed hyperlipidemia   Previous LDL cholesterol elevated. Switched from regular cream to unsweetened coconut milk,  potentially improving cholesterol levels. - Order lipid panel to assess current cholesterol levels      Relevant Medications   hydrochlorothiazide  (HYDRODIURIL ) 25 MG tablet   Prediabetes   Previous A1c 6.4%. He has cut out sugar from his diet and changed his cream to unsweetened coconut milk. Will monitor labs today.       Relevant Orders   CBC with Differential/Platelet   CMP14+EGFR   Lipid panel   Hemoglobin A1c   Fatty liver   Previous liver enzymes normalized. He is working on a regular diet with low fat and low sugar options and walking daily. Will recheck enzymes today. No tenderness or hepatomegaly noted on examination.       Relevant Orders   CBC with Differential/Platelet   CMP14+EGFR   Lipid panel   Hemoglobin A1c   Hypertension - Primary   Blood pressure well-controlled on current medication regimen of hydrochlorothiazide  25mg  once a day. No headaches, vision changes, or dizziness. Well-hydrated without dehydration symptoms on hydrochlorothiazide . Recommend moving medication to an area that he visits every morning to aid in remembering to take his medications routinely.  - Continue hydrochlorothiazide  - Change prescription to 90-day supply       Relevant Medications   hydrochlorothiazide  (HYDRODIURIL ) 25 MG tablet   Right knee pain   Knee arthritis and ligament injury with occasional tightness but no significant symptoms or recent exacerbations.       Nocturia   Reports nocturia 1-4 times per night without hesitancy or weak stream. PSA levels normal. Discussed potential causes including overactive bladder and benign prostatic hypertrophy. - Consider Flomax  if symptoms persist - Consider referral to urology if symptoms worsen      BMI 27.0-27.9,adult   Continue with daily walking and dietary modifications with low fat and low sugar options. Weight is stable.       Relevant Orders   CBC with Differential/Platelet   CMP14+EGFR   Lipid panel   Hemoglobin  A1c   Right ankle instability   Recurrent right ankle sprains due to ligamentous laxity, occurring 2-3 times per year, often when walking dogs. Adapts by allowing himself to fall to prevent further injury. Discussed recommendation of daily strengthening exercises to help prevent instability.  - Consider writing ABC's with each foot at least once daily to help with strengthening and flexibility of the ankles.       Vitamin D  deficiency   Continue on once a week vitamin D3 dosing. Will monitor levels today.       Relevant Orders   VITAMIN D  25 Hydroxy (Vit-D Deficiency, Fractures)   Other Visit Diagnoses       Elevated LDL cholesterol level       Relevant Orders   CBC with Differential/Platelet   CMP14+EGFR   Lipid panel   Hemoglobin A1c  Return in about 6 months (around 03/03/2024) for CPE.  Shawn Doing, DNP, AGNP-c

## 2023-09-01 NOTE — Assessment & Plan Note (Signed)
 Knee arthritis and ligament injury with occasional tightness but no significant symptoms or recent exacerbations.

## 2023-09-01 NOTE — Patient Instructions (Addendum)
 It was so good to see you today! You look very good! I am very happy with your blood pressure readings!  We will plan to see you in 6 months with your physical, unless you need to be seen sooner.   Your weight looks good!   We will plan to continue the vitamin D  and hydrochlorothiazide .  If we need to make any changes based on your labs, I will let you know.   You can try stretching your ankles by writing your ABC's in the air with your foot. This can help to strengthen the ligaments and tendons in the ankle.  Keep up your walking routine with the dogs! This is a great source of exercise.  Be sure you are staying well hydrated while you are on long walks and when it is hot.

## 2023-09-02 ENCOUNTER — Ambulatory Visit: Payer: Self-pay | Admitting: Nurse Practitioner

## 2023-09-02 LAB — CMP14+EGFR
ALT: 21 IU/L (ref 0–44)
AST: 26 IU/L (ref 0–40)
Albumin: 4.5 g/dL (ref 3.8–4.9)
Alkaline Phosphatase: 65 IU/L (ref 44–121)
BUN/Creatinine Ratio: 18 (ref 9–20)
BUN: 16 mg/dL (ref 6–24)
Bilirubin Total: 0.8 mg/dL (ref 0.0–1.2)
CO2: 20 mmol/L (ref 20–29)
Calcium: 9.6 mg/dL (ref 8.7–10.2)
Chloride: 100 mmol/L (ref 96–106)
Creatinine, Ser: 0.91 mg/dL (ref 0.76–1.27)
Globulin, Total: 2.4 g/dL (ref 1.5–4.5)
Glucose: 101 mg/dL — ABNORMAL HIGH (ref 70–99)
Potassium: 4.7 mmol/L (ref 3.5–5.2)
Sodium: 135 mmol/L (ref 134–144)
Total Protein: 6.9 g/dL (ref 6.0–8.5)
eGFR: 97 mL/min/1.73 (ref >59)

## 2023-09-02 LAB — CBC WITH DIFFERENTIAL/PLATELET
Basophils Absolute: 0 x10E3/uL (ref 0.0–0.2)
Basos: 1 %
EOS (ABSOLUTE): 0.1 x10E3/uL (ref 0.0–0.4)
Eos: 2 %
Hematocrit: 44.5 % (ref 37.5–51.0)
Hemoglobin: 14.6 g/dL (ref 13.0–17.7)
Immature Grans (Abs): 0 x10E3/uL (ref 0.0–0.1)
Immature Granulocytes: 0 %
Lymphocytes Absolute: 2.1 x10E3/uL (ref 0.7–3.1)
Lymphs: 38 %
MCH: 30 pg (ref 26.6–33.0)
MCHC: 32.8 g/dL (ref 31.5–35.7)
MCV: 92 fL (ref 79–97)
Monocytes Absolute: 0.3 x10E3/uL (ref 0.1–0.9)
Monocytes: 6 %
Neutrophils Absolute: 3 x10E3/uL (ref 1.4–7.0)
Neutrophils: 53 %
Platelets: 236 x10E3/uL (ref 150–450)
RBC: 4.86 x10E6/uL (ref 4.14–5.80)
RDW: 12.9 % (ref 11.6–15.4)
WBC: 5.5 x10E3/uL (ref 3.4–10.8)

## 2023-09-02 LAB — HEMOGLOBIN A1C
Est. average glucose Bld gHb Est-mCnc: 120 mg/dL
Hgb A1c MFr Bld: 5.8 % — ABNORMAL HIGH (ref 4.8–5.6)

## 2023-09-02 LAB — LIPID PANEL
Chol/HDL Ratio: 4.4 ratio (ref 0.0–5.0)
Cholesterol, Total: 201 mg/dL — ABNORMAL HIGH (ref 100–199)
HDL: 46 mg/dL (ref >39)
LDL Chol Calc (NIH): 134 mg/dL — ABNORMAL HIGH (ref 0–99)
Triglycerides: 116 mg/dL (ref 0–149)
VLDL Cholesterol Cal: 21 mg/dL (ref 5–40)

## 2023-09-02 LAB — VITAMIN D 25 HYDROXY (VIT D DEFICIENCY, FRACTURES): Vit D, 25-Hydroxy: 55.4 ng/mL (ref 30.0–100.0)

## 2023-09-27 DIAGNOSIS — S62647A Nondisplaced fracture of proximal phalanx of left little finger, initial encounter for closed fracture: Secondary | ICD-10-CM | POA: Diagnosis not present

## 2023-10-03 DIAGNOSIS — S62647D Nondisplaced fracture of proximal phalanx of left little finger, subsequent encounter for fracture with routine healing: Secondary | ICD-10-CM | POA: Diagnosis not present

## 2023-10-07 ENCOUNTER — Other Ambulatory Visit: Payer: Self-pay | Admitting: Nurse Practitioner

## 2023-10-07 ENCOUNTER — Encounter: Payer: Self-pay | Admitting: Nurse Practitioner

## 2023-10-07 ENCOUNTER — Telehealth: Payer: Self-pay

## 2023-10-07 DIAGNOSIS — K122 Cellulitis and abscess of mouth: Secondary | ICD-10-CM

## 2023-10-07 MED ORDER — CLINDAMYCIN HCL 300 MG PO CAPS: 300.0000 mg | ORAL_CAPSULE | Freq: Three times a day (TID) | ORAL | 0 refills | Status: AC

## 2023-10-07 NOTE — Telephone Encounter (Signed)
 Patient is calling back to get the antibiotic that his dentist is requesting. Patient is requesting a call back today as he needs to start the medication today.

## 2023-10-07 NOTE — Telephone Encounter (Signed)
 I have forwarded this message to you for review and have printed it off for you to review. Let me know and I can call the dentist office back. Thanks.    Copied from CRM [id number]. Topic: Clinical - Medication Question >> Oct 07, 2023 10:45 AM Myrick T wrote: Reason for CRM: patient had dentist appt this morning and will need an antibiotic. Melody from [facility] called stated patient is allergic to penicillin and the Clindamycinis processed through the liver and patient has liver disease. Please contact Melody only at [phone]. Melody asked that a message not be left as she would be the only person to speak with on this matter.

## 2023-10-08 NOTE — Telephone Encounter (Signed)
 Medication sent 09/16 at the end of the business day.

## 2023-11-10 DIAGNOSIS — M79641 Pain in right hand: Secondary | ICD-10-CM | POA: Diagnosis not present

## 2023-11-10 DIAGNOSIS — M25641 Stiffness of right hand, not elsewhere classified: Secondary | ICD-10-CM | POA: Diagnosis not present

## 2023-11-10 DIAGNOSIS — M79645 Pain in left finger(s): Secondary | ICD-10-CM | POA: Diagnosis not present

## 2023-11-10 DIAGNOSIS — S6291XD Unspecified fracture of right wrist and hand, subsequent encounter for fracture with routine healing: Secondary | ICD-10-CM | POA: Diagnosis not present

## 2024-03-08 ENCOUNTER — Encounter: Payer: Medicaid Other | Admitting: Nurse Practitioner
